# Patient Record
Sex: Female | Born: 1975
Health system: Southern US, Community
[De-identification: ages and names within clinical notes are randomized; demographics above are authoritative.]

## PROBLEM LIST (undated history)

## (undated) DIAGNOSIS — Z8619 Personal history of other infectious and parasitic diseases: Secondary | ICD-10-CM

## (undated) DIAGNOSIS — F329 Major depressive disorder, single episode, unspecified: Secondary | ICD-10-CM

## (undated) DIAGNOSIS — F32A Depression, unspecified: Secondary | ICD-10-CM

## (undated) DIAGNOSIS — R519 Headache, unspecified: Secondary | ICD-10-CM

## (undated) DIAGNOSIS — Z87442 Personal history of urinary calculi: Secondary | ICD-10-CM

## (undated) DIAGNOSIS — J45909 Unspecified asthma, uncomplicated: Secondary | ICD-10-CM

## (undated) DIAGNOSIS — N200 Calculus of kidney: Secondary | ICD-10-CM

## (undated) DIAGNOSIS — N289 Disorder of kidney and ureter, unspecified: Secondary | ICD-10-CM

## (undated) DIAGNOSIS — N390 Urinary tract infection, site not specified: Secondary | ICD-10-CM

## (undated) DIAGNOSIS — Z860100 Personal history of colon polyps, unspecified: Secondary | ICD-10-CM

## (undated) DIAGNOSIS — R51 Headache: Secondary | ICD-10-CM

## (undated) DIAGNOSIS — Z8489 Family history of other specified conditions: Secondary | ICD-10-CM

## (undated) HISTORY — DX: Headache, unspecified: R51.9

## (undated) HISTORY — DX: Personal history of colon polyps, unspecified: Z86.0100

## (undated) HISTORY — DX: Headache: R51

## (undated) HISTORY — DX: Personal history of colonic polyps: Z86.010

## (undated) HISTORY — DX: Urinary tract infection, site not specified: N39.0

## (undated) HISTORY — DX: Personal history of other infectious and parasitic diseases: Z86.19

## (undated) HISTORY — PX: ABLATION: SHX5711

## (undated) HISTORY — PX: CYSTOSCOPY W/ URETEROSCOPY: SUR379

---

## 2002-03-07 HISTORY — PX: LITHOTRIPSY: SUR834

## 2003-03-08 HISTORY — PX: TUBAL LIGATION: SHX77

## 2010-06-18 ENCOUNTER — Emergency Department (HOSPITAL_COMMUNITY)
Admission: EM | Admit: 2010-06-18 | Discharge: 2010-06-18 | Disposition: A | Discharge status: Home / Self Care | Payer: Medicaid Other | Attending: Emergency Medicine | Admitting: Emergency Medicine

## 2010-06-18 DIAGNOSIS — R059 Cough, unspecified: Secondary | ICD-10-CM | POA: Insufficient documentation

## 2010-06-18 DIAGNOSIS — R05 Cough: Secondary | ICD-10-CM | POA: Insufficient documentation

## 2010-06-18 DIAGNOSIS — J45909 Unspecified asthma, uncomplicated: Secondary | ICD-10-CM | POA: Insufficient documentation

## 2010-06-18 DIAGNOSIS — Y9289 Other specified places as the place of occurrence of the external cause: Secondary | ICD-10-CM | POA: Insufficient documentation

## 2010-06-18 DIAGNOSIS — S01501A Unspecified open wound of lip, initial encounter: Secondary | ICD-10-CM | POA: Insufficient documentation

## 2010-06-18 DIAGNOSIS — R55 Syncope and collapse: Secondary | ICD-10-CM | POA: Insufficient documentation

## 2010-06-18 DIAGNOSIS — W19XXXA Unspecified fall, initial encounter: Secondary | ICD-10-CM | POA: Insufficient documentation

## 2010-06-18 LAB — CBC
MCV: 86.9 fL (ref 78.0–100.0)
Platelets: 264 10*3/uL (ref 150–400)
RBC: 4.51 MIL/uL (ref 3.87–5.11)
WBC: 10.6 10*3/uL — ABNORMAL HIGH (ref 4.0–10.5)

## 2010-06-18 LAB — DIFFERENTIAL
Basophils Absolute: 0.1 10*3/uL (ref 0.0–0.1)
Basophils Relative: 1 % (ref 0–1)
Eosinophils Absolute: 0.8 10*3/uL — ABNORMAL HIGH (ref 0.0–0.7)
Lymphs Abs: 2.3 10*3/uL (ref 0.7–4.0)
Neutrophils Relative %: 65 % (ref 43–77)

## 2010-06-18 LAB — BASIC METABOLIC PANEL
Chloride: 107 mEq/L (ref 96–112)
GFR calc Af Amer: >60 mL/min (ref >60)
Potassium: 4 mEq/L (ref 3.5–5.1)
Sodium: 139 mEq/L (ref 135–145)

## 2010-11-16 ENCOUNTER — Other Ambulatory Visit (HOSPITAL_COMMUNITY)
Admission: RE | Admit: 2010-11-16 | Discharge: 2010-11-16 | Disposition: A | Discharge status: Home / Self Care | Payer: Commercial Managed Care - PPO | Source: Ambulatory Visit | Attending: Family Medicine | Admitting: Family Medicine

## 2010-11-16 ENCOUNTER — Other Ambulatory Visit: Payer: Self-pay | Admitting: Family Medicine

## 2010-11-16 DIAGNOSIS — Z1159 Encounter for screening for other viral diseases: Secondary | ICD-10-CM | POA: Insufficient documentation

## 2010-11-16 DIAGNOSIS — Z124 Encounter for screening for malignant neoplasm of cervix: Secondary | ICD-10-CM | POA: Insufficient documentation

## 2012-04-03 ENCOUNTER — Emergency Department (HOSPITAL_COMMUNITY)
Admission: EM | Admit: 2012-04-03 | Discharge: 2012-04-03 | Disposition: A | Discharge status: Home / Self Care | Payer: 59 | Attending: Emergency Medicine | Admitting: Emergency Medicine

## 2012-04-03 ENCOUNTER — Encounter (HOSPITAL_COMMUNITY): Payer: Self-pay | Admitting: Adult Health

## 2012-04-03 ENCOUNTER — Emergency Department (HOSPITAL_COMMUNITY): Payer: 59

## 2012-04-03 DIAGNOSIS — F329 Major depressive disorder, single episode, unspecified: Secondary | ICD-10-CM | POA: Insufficient documentation

## 2012-04-03 DIAGNOSIS — N200 Calculus of kidney: Secondary | ICD-10-CM | POA: Insufficient documentation

## 2012-04-03 DIAGNOSIS — R109 Unspecified abdominal pain: Secondary | ICD-10-CM | POA: Insufficient documentation

## 2012-04-03 DIAGNOSIS — Z79899 Other long term (current) drug therapy: Secondary | ICD-10-CM | POA: Insufficient documentation

## 2012-04-03 DIAGNOSIS — R110 Nausea: Secondary | ICD-10-CM | POA: Insufficient documentation

## 2012-04-03 DIAGNOSIS — Z3202 Encounter for pregnancy test, result negative: Secondary | ICD-10-CM | POA: Insufficient documentation

## 2012-04-03 DIAGNOSIS — M549 Dorsalgia, unspecified: Secondary | ICD-10-CM | POA: Insufficient documentation

## 2012-04-03 DIAGNOSIS — Z87448 Personal history of other diseases of urinary system: Secondary | ICD-10-CM | POA: Insufficient documentation

## 2012-04-03 DIAGNOSIS — F3289 Other specified depressive episodes: Secondary | ICD-10-CM | POA: Insufficient documentation

## 2012-04-03 HISTORY — DX: Major depressive disorder, single episode, unspecified: F32.9

## 2012-04-03 HISTORY — DX: Disorder of kidney and ureter, unspecified: N28.9

## 2012-04-03 HISTORY — DX: Depression, unspecified: F32.A

## 2012-04-03 LAB — CBC WITH DIFFERENTIAL/PLATELET
Basophils Absolute: 0 10*3/uL (ref 0.0–0.1)
HCT: 35 % — ABNORMAL LOW (ref 36.0–46.0)
Hemoglobin: 12.5 g/dL (ref 12.0–15.0)
Lymphocytes Relative: 27 % (ref 12–46)
Monocytes Absolute: 0.6 10*3/uL (ref 0.1–1.0)
Monocytes Relative: 6 % (ref 3–12)
Neutro Abs: 5.3 10*3/uL (ref 1.7–7.7)
Neutrophils Relative %: 59 % (ref 43–77)
RDW: 13.1 % (ref 11.5–15.5)
WBC: 9 10*3/uL (ref 4.0–10.5)

## 2012-04-03 LAB — URINALYSIS, ROUTINE W REFLEX MICROSCOPIC
Glucose, UA: NEGATIVE mg/dL
Leukocytes, UA: NEGATIVE
Nitrite: NEGATIVE
Specific Gravity, Urine: 1.028 (ref 1.005–1.030)
pH: 6 (ref 5.0–8.0)

## 2012-04-03 LAB — BASIC METABOLIC PANEL
CO2: 23 mEq/L (ref 19–32)
Chloride: 106 mEq/L (ref 96–112)
Creatinine, Ser: 1.1 mg/dL (ref 0.50–1.10)
GFR calc Af Amer: 73 mL/min — ABNORMAL LOW (ref >90)
Potassium: 3.8 mEq/L (ref 3.5–5.1)

## 2012-04-03 LAB — URINE MICROSCOPIC-ADD ON

## 2012-04-03 LAB — POCT PREGNANCY, URINE: Preg Test, Ur: NEGATIVE

## 2012-04-03 MED ORDER — KETOROLAC TROMETHAMINE 30 MG/ML IJ SOLN
30.0000 mg | Freq: Once | INTRAMUSCULAR | Status: AC
  Administered 2012-04-03: 30 mg via INTRAVENOUS
  Filled 2012-04-03: qty 1

## 2012-04-03 MED ORDER — SODIUM CHLORIDE 0.9 % IV SOLN
Freq: Once | INTRAVENOUS | Status: AC
  Administered 2012-04-03: 1000 mL via INTRAVENOUS

## 2012-04-03 MED ORDER — HYDROMORPHONE HCL PF 1 MG/ML IJ SOLN
1.0000 mg | Freq: Once | INTRAMUSCULAR | Status: AC
  Administered 2012-04-03: 1 mg via INTRAMUSCULAR
  Filled 2012-04-03: qty 1

## 2012-04-03 MED ORDER — ONDANSETRON HCL 4 MG/2ML IJ SOLN
4.0000 mg | Freq: Once | INTRAMUSCULAR | Status: AC
  Administered 2012-04-03: 4 mg via INTRAVENOUS
  Filled 2012-04-03: qty 2

## 2012-04-03 MED ORDER — HYDROCODONE-ACETAMINOPHEN 5-325 MG PO TABS
1.0000 | ORAL_TABLET | Freq: Once | ORAL | Status: AC
  Administered 2012-04-03: 1 via ORAL
  Filled 2012-04-03: qty 1

## 2012-04-03 MED ORDER — PROMETHAZINE HCL 25 MG PO TABS: 25.0000 mg | ORAL_TABLET | Freq: Four times a day (QID) | ORAL | Status: DC | PRN

## 2012-04-03 MED ORDER — HYDROCODONE-ACETAMINOPHEN 5-325 MG PO TABS: 2.0000 | ORAL_TABLET | ORAL | Status: DC | PRN

## 2012-04-03 NOTE — ED Provider Notes (Signed)
Medical screening examination/treatment/procedure(s) were performed by non-physician practitioner and as supervising physician I was immediately available for consultation/collaboration.  Sunnie Nielsen, MD 04/03/12 2257

## 2012-04-03 NOTE — ED Notes (Signed)
Patient transported to CT 

## 2012-04-03 NOTE — ED Provider Notes (Signed)
History     CSN: [id number]  Arrival date & time 04/03/12  0207   First MD Initiated Contact with Patient 04/03/12 0247      Chief Complaint  Patient presents with  . Nephrolithiasis    (Consider location/radiation/quality/duration/timing/severity/associated sxs/prior treatment) HPI Comments: Patient with a history of multiple kidney stones.  Last being in 2011.  She states the last 2, stone.  She's been able to pass spontaneously, but prior to, that she's had lithotripsy, and cystoscopy along with stenting.  She has been followed by a physician in [facility], but has recently moved to [place], and started seeing a light.  Urology tonight.  Her pain started about 7 PM by 10.  It was unbearable.  She's been taking over-the-counter abdomen, without relief.  She reports, right flank pain, radiating to the right lower abdomen, waxing and waning in intensity, associated with nausea, but no vomiting.   The history is provided by the patient.    Past Medical History  Diagnosis Date  . Renal disorder   . Depression     History reviewed. No pertinent past surgical history.  History reviewed. No pertinent family history.  History  Substance Use Topics  . Smoking status: Never Smoker   . Smokeless tobacco: Not on file  . Alcohol Use: No    OB History    Grav Para Term Preterm Abortions TAB SAB Ect Mult Living                  Review of Systems  Constitutional: Negative for fever and chills.  HENT: Negative.   Eyes: Negative.   Respiratory: Negative.   Cardiovascular: Negative.   Gastrointestinal: Positive for nausea and abdominal pain. Negative for vomiting, diarrhea and constipation.  Genitourinary: Positive for flank pain. Negative for dysuria, urgency, frequency and hematuria.  Musculoskeletal: Positive for back pain.  Skin: Negative for rash and wound.  Neurological: Negative.     Allergies  Penicillins  Home Medications   Current Outpatient Rx  Name  Route   Sig  Dispense  Refill  . DULOXETINE HCL 60 MG PO CPEP   Oral   Take 120 mg by mouth daily.         . IBUPROFEN 800 MG PO TABS   Oral   Take 800 mg by mouth every 8 (eight) hours as needed. pain         . LAMOTRIGINE 100 MG PO TABS   Oral   Take 100 mg by mouth daily.         Marland Kitchen HYDROCODONE-ACETAMINOPHEN 5-325 MG PO TABS   Oral   Take 2 tablets by mouth every 4 (four) hours as needed for pain.   10 tablet   0   . PROMETHAZINE HCL 25 MG PO TABS   Oral   Take 1 tablet (25 mg total) by mouth every 6 (six) hours as needed for nausea.   30 tablet   0     BP 130/85  Pulse 81  Temp 97.5 F (36.4 C) (Oral)  Resp 16  SpO2 99%  Physical Exam  Constitutional: She appears well-developed and well-nourished.  HENT:  Head: Normocephalic.  Eyes: Pupils are equal, round, and reactive to light.  Pulmonary/Chest: Effort normal.  Musculoskeletal: Normal range of motion.  Neurological: She is alert.  Skin: Skin is warm. There is pallor.    ED Course  Procedures (including critical care time)  Labs Reviewed  URINALYSIS, ROUTINE W REFLEX MICROSCOPIC - Abnormal; Notable for  the following:    Hgb urine dipstick SMALL (*)     All other components within normal limits  CBC WITH DIFFERENTIAL - Abnormal; Notable for the following:    HCT 35.0 (*)     Eosinophils Relative 8 (*)     All other components within normal limits  BASIC METABOLIC PANEL - Abnormal; Notable for the following:    Glucose, Bld 126 (*)     GFR calc non Af Amer 63 (*)     GFR calc Af Amer 73 (*)     All other components within normal limits  URINE MICROSCOPIC-ADD ON - Abnormal; Notable for the following:    Squamous Epithelial / LPF FEW (*)     Bacteria, UA FEW (*)     All other components within normal limits  POCT PREGNANCY, URINE  URINE CULTURE   Ct Abdomen Pelvis Wo Contrast  04/03/2012  *RADIOLOGY REPORT*  Clinical Data: Right flank pain  CT ABDOMEN AND PELVIS WITHOUT CONTRAST  Technique:   Multidetector CT imaging of the abdomen and pelvis was performed following the standard protocol without intravenous contrast.  Comparison: None.  Findings: Limited images through the lung bases demonstrate no significant appreciable abnormality. The heart size is within normal limits. No pleural or pericardial effusion.  Organ abnormality/lesion detection is limited in the absence of intravenous contrast. Within this limitation, geographic area of hypodensity within segment four is favored to be focal fat. Unremarkable biliary system, spleen, pancreas, adrenal glands.  Bilateral renal stones and papillary tip calcifications.  No hydronephrosis on the left.  On the right, there is moderate hydroureteronephrosis to the level of a 5 mm distal right ureteral stone.  Colonic diverticulosis.  No CT evidence for diverticulitis or colitis.  Normal appendix.  No bowel obstruction.  No free intraperitoneal air or fluid.  No lymphadenopathy.  Normal caliber aorta and branch vessels.  Decompressed bladder with a small amount of air non dependently, nonspecific.  Unremarkable unenhanced appearance to the uterus and adnexa.  No acute osseous finding.  L5 S1 degenerative disc disease.  IMPRESSION: Moderate right hydroureteronephrosis to the level of a 5 mm distal right ureteral stone.  Additional bilateral nonobstructing renal stones and papillary tip calcifications.  Small amount of air non dependently within the bladder. Differential includes infection, instrumentation, or fistulous communication to bowel.  There is colonic diverticulosis but no active inflammation or overt fistula.   Original Report Authenticated By: Jearld Lesch, M.D.      1. Kidney stone on right side       MDM   Feeling much better after IV Dilaudid and Toradol CT Scan shows 5 mm stone in disatal R ureter,  patient will not have a ride until 7 AM she would be kept and monitored until that time       Arman Filter, NP 04/03/12 1942

## 2012-04-03 NOTE — ED Notes (Signed)
Hx of Kidney stones. Presents today with right sided flank pain that began yesterday morning and has progressively worsened since midnight associated with nausea and inability to void. painis described as sharp and radiated to lower pelvis.

## 2012-04-03 NOTE — ED Notes (Signed)
Pt back from CT

## 2012-04-04 LAB — URINE CULTURE: Colony Count: NO GROWTH

## 2012-04-06 ENCOUNTER — Other Ambulatory Visit: Payer: Self-pay | Admitting: Urology

## 2012-04-06 ENCOUNTER — Encounter (HOSPITAL_BASED_OUTPATIENT_CLINIC_OR_DEPARTMENT_OTHER): Payer: Self-pay | Admitting: *Deleted

## 2012-04-06 NOTE — Progress Notes (Signed)
To [facility] at 0615-Hg on arrival-Npo after Mn-may take pain meds with small amt water if needed that am .

## 2012-04-08 NOTE — Anesthesia Preprocedure Evaluation (Addendum)
Anesthesia Evaluation  Patient identified by MRN, date of birth, ID band Patient awake    Reviewed: Allergy & Precautions, H&P , NPO status , Patient's Chart, lab work & pertinent test results  Airway Mallampati: II TM Distance: >3 FB Neck ROM: full    Dental No notable dental hx. (+) Teeth Intact and Dental Advisory Given   Pulmonary neg pulmonary ROS,  breath sounds clear to auscultation  Pulmonary exam normal       Cardiovascular Exercise Tolerance: Good negative cardio ROS  Rhythm:regular Rate:Normal     Neuro/Psych negative neurological ROS  negative psych ROS   GI/Hepatic negative GI ROS, Neg liver ROS,   Endo/Other  negative endocrine ROS  Renal/GU negative Renal ROS  negative genitourinary   Musculoskeletal   Abdominal   Peds  Hematology negative hematology ROS (+)   Anesthesia Other Findings   Reproductive/Obstetrics negative OB ROS                          Anesthesia Physical Anesthesia Plan  ASA: II  Anesthesia Plan: General   Post-op Pain Management:    Induction: Intravenous  Airway Management Planned: LMA  Additional Equipment:   Intra-op Plan:   Post-operative Plan:   Informed Consent: I have reviewed the patients History and Physical, chart, labs and discussed the procedure including the risks, benefits and alternatives for the proposed anesthesia with the patient or authorized representative who has indicated his/her understanding and acceptance.   Dental Advisory Given  Plan Discussed with: CRNA and Surgeon  Anesthesia Plan Comments:        Anesthesia Quick Evaluation  

## 2012-04-08 NOTE — H&P (Signed)
  H&P  Chief Complaint: Kidney stone  History of Present Illness: Monica Williams is a 37 y.o. year old who presents for ureteroscopic stone extraction of a symptomatic right distal ureteral stone. She was seen by Dr. Sherron Monday last week for this.Her original note is below:  In the last few days, she has had right flank pain associated with nausea. She went to the emergency room. She had a distal right stone in the lower ureter associated with moderate hydronephrosis. She had non-affecting stones bilaterally and perhaps calcification at the papillae. She has had multiple stents and lithotripsies and ureteroscopy's in the past.   There is no other modifying factors or associated signs or symptoms. There is no other aggravating or relieving factors. She is still having right-flank pain. The symptoms are moderate in severity and recurring. She tries to minimize Vicodin so she does not get drowsy. Phenergan controls her nausea.   Past Medical History  Diagnosis Date  . Renal disorder   . Depression     Past Surgical History  Procedure Date  . Lithotripsy 2004  . Cystoscopy w/ ureteroscopy   . Tubal ligation   . Ablation     Home Medications:  No prescriptions prior to admission    Allergies:  Allergies  Allergen Reactions  . Penicillins Hives    History reviewed. No pertinent family history.  Social History:  reports that she has never smoked. She does not have any smokeless tobacco history on file. She reports that she does not drink alcohol or use illicit drugs.  ROS: A complete review of systems was performed.  All systems are negative except for pertinent findings as noted. Right flank pain.  Physical Exam:  Vital signs in last 24 hours:   General:  Alert and oriented, No acute distress HEENT: Normocephalic, atraumatic Neck: No JVD or lymphadenopathy Cardiovascular: Regular rate and rhythm Lungs: Clear bilaterally Abdomen: Soft, nontender, nondistended, no abdominal  masses Back: No CVA tenderness Extremities: No edema Neurologic: Grossly intact  Laboratory Data:  No results found for this or any previous visit (from the past 24 hour(s)). Recent Results (from the past 240 hour(s))  URINE CULTURE     Status: Normal   Collection Time   04/03/12  2:33 AM      Component Value Range Status Comment   Specimen Description URINE, CLEAN CATCH   Final    Special Requests ADDED [phone]   Final    Culture  Setup Time 04/03/2012 03:59   Final    Colony Count NO GROWTH   Final    Culture NO GROWTH   Final    Report Status 04/04/2012 FINAL   Final    Creatinine:  Basename 04/03/12 0223  CREATININE 1.10    Radiologic Imaging: No results found.  Impression/Assessment:  6 mm right distal ureteral calculus  Plan:  Ureteroscopic stone extraction  Chelsea Aus 04/08/2012, 11:49 AM  Bertram Millard. Myrtis Maille MD

## 2012-04-09 ENCOUNTER — Ambulatory Visit (HOSPITAL_BASED_OUTPATIENT_CLINIC_OR_DEPARTMENT_OTHER): Payer: 59 | Admitting: Anesthesiology

## 2012-04-09 ENCOUNTER — Encounter (HOSPITAL_BASED_OUTPATIENT_CLINIC_OR_DEPARTMENT_OTHER): Payer: Self-pay | Admitting: Anesthesiology

## 2012-04-09 ENCOUNTER — Ambulatory Visit (HOSPITAL_BASED_OUTPATIENT_CLINIC_OR_DEPARTMENT_OTHER)
Admission: RE | Admit: 2012-04-09 | Discharge: 2012-04-09 | Disposition: A | Discharge status: Home / Self Care | Payer: 59 | Source: Ambulatory Visit | Attending: Urology | Admitting: Urology

## 2012-04-09 ENCOUNTER — Encounter (HOSPITAL_BASED_OUTPATIENT_CLINIC_OR_DEPARTMENT_OTHER): Payer: Self-pay | Admitting: *Deleted

## 2012-04-09 ENCOUNTER — Encounter (HOSPITAL_BASED_OUTPATIENT_CLINIC_OR_DEPARTMENT_OTHER)
Admission: RE | Disposition: A | Discharge status: Home / Self Care | Payer: Self-pay | Source: Ambulatory Visit | Attending: Urology

## 2012-04-09 DIAGNOSIS — N201 Calculus of ureter: Secondary | ICD-10-CM | POA: Insufficient documentation

## 2012-04-09 HISTORY — PX: HOLMIUM LASER APPLICATION: SHX5852

## 2012-04-09 HISTORY — PX: CYSTOSCOPY WITH RETROGRADE PYELOGRAM, URETEROSCOPY AND STENT PLACEMENT: SHX5789

## 2012-04-09 LAB — POCT HEMOGLOBIN-HEMACUE: Hemoglobin: 12.2 g/dL (ref 12.0–15.0)

## 2012-04-09 SURGERY — HOLMIUM LASER APPLICATION
Anesthesia: General | Site: Ureter | Laterality: Right | Wound class: Clean Contaminated

## 2012-04-09 MED ORDER — SODIUM CHLORIDE 0.9 % IR SOLN
Status: DC | PRN
  Administered 2012-04-09: 6000 mL via INTRAVESICAL

## 2012-04-09 MED ORDER — ACETAMINOPHEN 650 MG RE SUPP
650.0000 mg | RECTAL | Status: DC | PRN
  Filled 2012-04-09: qty 1

## 2012-04-09 MED ORDER — SODIUM CHLORIDE 0.9 % IV SOLN
250.0000 mL | INTRAVENOUS | Status: DC | PRN
  Filled 2012-04-09: qty 250

## 2012-04-09 MED ORDER — ONDANSETRON HCL 4 MG/2ML IJ SOLN
INTRAMUSCULAR | Status: DC | PRN
  Administered 2012-04-09: 4 mg via INTRAVENOUS

## 2012-04-09 MED ORDER — CIPROFLOXACIN HCL 250 MG PO TABS: 250.0000 mg | ORAL_TABLET | Freq: Two times a day (BID) | ORAL | Status: DC

## 2012-04-09 MED ORDER — BELLADONNA ALKALOIDS-OPIUM 16.2-60 MG RE SUPP
RECTAL | Status: DC | PRN
  Administered 2012-04-09: 1 via RECTAL

## 2012-04-09 MED ORDER — LACTATED RINGERS IV SOLN
INTRAVENOUS | Status: DC
  Filled 2012-04-09: qty 1000

## 2012-04-09 MED ORDER — MIDAZOLAM HCL 5 MG/5ML IJ SOLN
INTRAMUSCULAR | Status: DC | PRN
  Administered 2012-04-09: 2 mg via INTRAVENOUS

## 2012-04-09 MED ORDER — LACTATED RINGERS IV SOLN
INTRAVENOUS | Status: DC
  Administered 2012-04-09: 07:00:00 via INTRAVENOUS
  Filled 2012-04-09: qty 1000

## 2012-04-09 MED ORDER — ACETAMINOPHEN 325 MG PO TABS
650.0000 mg | ORAL_TABLET | ORAL | Status: DC | PRN
  Filled 2012-04-09: qty 2

## 2012-04-09 MED ORDER — LIDOCAINE HCL (CARDIAC) 20 MG/ML IV SOLN
INTRAVENOUS | Status: DC | PRN
  Administered 2012-04-09: 25 mg via INTRAVENOUS
  Administered 2012-04-09: 75 mg via INTRAVENOUS

## 2012-04-09 MED ORDER — FENTANYL CITRATE 0.05 MG/ML IJ SOLN
25.0000 ug | INTRAMUSCULAR | Status: DC | PRN
  Filled 2012-04-09: qty 1

## 2012-04-09 MED ORDER — KETOROLAC TROMETHAMINE 30 MG/ML IJ SOLN
30.0000 mg | Freq: Four times a day (QID) | INTRAMUSCULAR | Status: DC
  Filled 2012-04-09: qty 1

## 2012-04-09 MED ORDER — PROPOFOL 10 MG/ML IV BOLUS
INTRAVENOUS | Status: DC | PRN
  Administered 2012-04-09: 100 mg via INTRAVENOUS
  Administered 2012-04-09: 200 mg via INTRAVENOUS

## 2012-04-09 MED ORDER — OXYCODONE HCL 5 MG PO TABS
5.0000 mg | ORAL_TABLET | ORAL | Status: DC | PRN
  Filled 2012-04-09: qty 2

## 2012-04-09 MED ORDER — OXYBUTYNIN CHLORIDE 5 MG PO TABS: 5.0000 mg | ORAL_TABLET | Freq: Three times a day (TID) | ORAL | Status: DC

## 2012-04-09 MED ORDER — DEXAMETHASONE SODIUM PHOSPHATE 4 MG/ML IJ SOLN
INTRAMUSCULAR | Status: DC | PRN
  Administered 2012-04-09: 10 mg via INTRAVENOUS

## 2012-04-09 MED ORDER — CIPROFLOXACIN IN D5W 400 MG/200ML IV SOLN
400.0000 mg | INTRAVENOUS | Status: DC
  Filled 2012-04-09: qty 200

## 2012-04-09 MED ORDER — FENTANYL CITRATE 0.05 MG/ML IJ SOLN
INTRAMUSCULAR | Status: DC | PRN
  Administered 2012-04-09: 50 ug via INTRAVENOUS
  Administered 2012-04-09 (×3): 25 ug via INTRAVENOUS
  Administered 2012-04-09: 50 ug via INTRAVENOUS
  Administered 2012-04-09 (×2): 12.5 ug via INTRAVENOUS

## 2012-04-09 MED ORDER — ONDANSETRON HCL 4 MG/2ML IJ SOLN
4.0000 mg | Freq: Four times a day (QID) | INTRAMUSCULAR | Status: DC | PRN
  Filled 2012-04-09: qty 2

## 2012-04-09 MED ORDER — KETOROLAC TROMETHAMINE 30 MG/ML IJ SOLN
INTRAMUSCULAR | Status: DC | PRN
  Administered 2012-04-09: 30 mg via INTRAVENOUS

## 2012-04-09 MED ORDER — SODIUM CHLORIDE 0.9 % IJ SOLN
3.0000 mL | Freq: Two times a day (BID) | INTRAMUSCULAR | Status: DC
  Filled 2012-04-09: qty 3

## 2012-04-09 MED ORDER — CIPROFLOXACIN IN D5W 400 MG/200ML IV SOLN
400.0000 mg | Freq: Two times a day (BID) | INTRAVENOUS | Status: DC
  Administered 2012-04-09: 400 mg via INTRAVENOUS
  Filled 2012-04-09: qty 200

## 2012-04-09 MED ORDER — MORPHINE SULFATE 2 MG/ML IJ SOLN
2.0000 mg | INTRAMUSCULAR | Status: DC | PRN
  Filled 2012-04-09: qty 1

## 2012-04-09 MED ORDER — IOHEXOL 350 MG/ML SOLN
INTRAVENOUS | Status: DC | PRN
  Administered 2012-04-09: 5 mL

## 2012-04-09 MED ORDER — SODIUM CHLORIDE 0.9 % IJ SOLN
3.0000 mL | INTRAMUSCULAR | Status: DC | PRN
  Filled 2012-04-09: qty 3

## 2012-04-09 MED ORDER — ALBUTEROL SULFATE (5 MG/ML) 0.5% IN NEBU
2.5000 mg | INHALATION_SOLUTION | Freq: Four times a day (QID) | RESPIRATORY_TRACT | Status: DC | PRN
  Administered 2012-04-09: 2.5 mg via RESPIRATORY_TRACT
  Filled 2012-04-09: qty 0.5

## 2012-04-09 MED ORDER — LACTATED RINGERS IV SOLN
INTRAVENOUS | Status: DC | PRN
  Administered 2012-04-09: 07:00:00 via INTRAVENOUS

## 2012-04-09 SURGICAL SUPPLY — 34 items
ADAPTER CATH URET PLST 4-6FR (CATHETERS) ×3 IMPLANT
BAG DRAIN URO-CYSTO SKYTR STRL (DRAIN) ×3 IMPLANT
BASKET LASER NITINOL 1.9FR (BASKET) IMPLANT
BASKET STNLS GEMINI 4WIRE 3FR (BASKET) IMPLANT
BASKET ZERO TIP NITINOL 2.4FR (BASKET) ×3 IMPLANT
BRUSH URET BIOPSY 3F (UROLOGICAL SUPPLIES) IMPLANT
CANISTER SUCT LVC 12 LTR MEDI- (MISCELLANEOUS) ×3 IMPLANT
CATH INTERMIT  6FR 70CM (CATHETERS) ×3 IMPLANT
CATH URET 5FR 28IN CONE TIP (BALLOONS)
CATH URET 5FR 28IN OPEN ENDED (CATHETERS) IMPLANT
CATH URET 5FR 70CM CONE TIP (BALLOONS) IMPLANT
CLOTH BEACON ORANGE TIMEOUT ST (SAFETY) ×3 IMPLANT
DRAPE CAMERA CLOSED 9X96 (DRAPES) ×3 IMPLANT
ELECT REM PT RETURN 9FT ADLT (ELECTROSURGICAL)
ELECTRODE REM PT RTRN 9FT ADLT (ELECTROSURGICAL) IMPLANT
GLOVE BIO SURGEON STRL SZ7 (GLOVE) ×3 IMPLANT
GLOVE BIO SURGEON STRL SZ8 (GLOVE) ×3 IMPLANT
GLOVE INDICATOR 7.0 STRL GRN (GLOVE) ×3 IMPLANT
GOWN PREVENTION PLUS LG XLONG (DISPOSABLE) ×6 IMPLANT
GOWN STRL REIN XL XLG (GOWN DISPOSABLE) ×3 IMPLANT
GUIDEWIRE 0.038 PTFE COATED (WIRE) IMPLANT
GUIDEWIRE ANG ZIPWIRE 038X150 (WIRE) IMPLANT
GUIDEWIRE STR DUAL SENSOR (WIRE) ×3 IMPLANT
IV NS IRRIG 3000ML ARTHROMATIC (IV SOLUTION) ×6 IMPLANT
KIT BALLIN UROMAX 15FX10 (LABEL) IMPLANT
KIT BALLN UROMAX 15FX4 (MISCELLANEOUS) IMPLANT
KIT BALLN UROMAX 26 75X4 (MISCELLANEOUS)
LASER FIBER DISP (UROLOGICAL SUPPLIES) ×3 IMPLANT
PACK CYSTOSCOPY (CUSTOM PROCEDURE TRAY) ×3 IMPLANT
SET HIGH PRES BAL DIL (LABEL)
SHEATH ACCESS URETERAL 38CM (SHEATH) IMPLANT
SHEATH ACCESS URETERAL 54CM (SHEATH) IMPLANT
STENT CONTOUR 6FRX24X.038 (STENTS) IMPLANT
STENT URET 6FRX24 CONTOUR (STENTS) ×3 IMPLANT

## 2012-04-09 NOTE — Transfer of Care (Signed)
Immediate Anesthesia Transfer of Care Note  Patient: Monica Williams  Procedure(s) Performed: Procedure(s) (LRB): HOLMIUM LASER APPLICATION (Right) CYSTOSCOPY WITH RETROGRADE PYELOGRAM, URETEROSCOPY AND STENT PLACEMENT (Right)  Patient Location: PACU  Anesthesia Type: General  Level of Consciousness: awake, sedated, patient cooperative and responds to stimulation  Airway & Oxygen Therapy: Patient Spontanous Breathing and Patient connected to face mask oxygen  Post-op Assessment: Report given to PACU RN, Post -op Vital signs reviewed and stable and Patient moving all extremities  Post vital signs: Reviewed and stable  Complications: No apparent anesthesia complications

## 2012-04-09 NOTE — Anesthesia Postprocedure Evaluation (Signed)
  Anesthesia Post-op Note  Patient: Monica Williams  Procedure(s) Performed: Procedure(s) (LRB): HOLMIUM LASER APPLICATION (Right) CYSTOSCOPY WITH RETROGRADE PYELOGRAM, URETEROSCOPY AND STENT PLACEMENT (Right)  Patient Location: PACU  Anesthesia Type: General  Level of Consciousness: awake and alert   Airway and Oxygen Therapy: Patient Spontanous Breathing  Post-op Pain: mild  Post-op Assessment: Post-op Vital signs reviewed, Patient's Cardiovascular Status Stable, Respiratory Function Stable, Patent Airway and No signs of Nausea or vomiting  Last Vitals:  Filed Vitals:   04/09/12 0900  BP: 116/76  Pulse: 86  Temp:   Resp: 10    Post-op Vital Signs: stable   Complications: No apparent anesthesia complications

## 2012-04-09 NOTE — Op Note (Signed)
Preoperative diagnosis: Obstructing right ureteral calculus  Postoperative diagnosis: Same   Procedure: Cystoscopy, right retrograde ureteropyelogram with interpretive fluoroscopy, right ureteroscopy with holmium laser fracture and extraction of right ureteral calculus, placement of 6 French by 24 cm contour double-J stent with tether    Surgeon: Bertram Millard. Maily Debarge, M.D.   Anesthesia: Gen.   Complications: None  Specimen(s): Stone fragments  Drain(s): Previously mentioned stent  Indications: 37 year-old female with persistently symptomatic right distal ureteral stone. She was seen by Dr. Annitta Jersey mid last week, and requested management of the stone. It was recommended that she undergo, because of the stone location, cystoscopy, right ureteroscopy and holmium laser/extraction of the stone. I discussed the procedure with the patient this morning, including risks and complications. She understands these and desires to proceed.    Technique and findings: I properly introduced myself to the patient. Her surgical site was properly marked and she was taken to the operating room where general anesthetic was administered with the LMA. She had been in Mr. preoperative IV Cipro. She was then placed in the dorsolithotomy position. Genitalia and perineum were prepped and draped and proper timeout was performed.  I then passed a 22 French cystoscope into the bladder which was inspected and found to be normal. Both ureteral orifices were normal in configuration and location. I passed a 6 [place] open-ended catheter into the right ureteral orifice and performed a retrograde ureteropyelogram.  This revealed a normal distal ureteral appearance to the point of the stone which appeared as a filling defect. I was unable to get contrast around the stone which seemed to be highly obstructing.  At this point, a 0.038 inch sensor-tip guidewire was advanced through the scope, by the stone with a curl seen in the  right renal pelvis. The cystoscope and the open-ended ureteral catheter were removed, with the guidewire left in place. I then passed a 6 French rigid ureteroscope under direct vision through the urethra and up to the ureteral orifice which was easily entered. I passed the scope perhaps 4 cm up the ureter, and encounter the stone which was just proximal to a moderate ureteral stricture. I pushed the stone up a bit farther, and navigated the ureteroscope through the stricture. Because I did not think I can remove the stone without fragmentation, I then passed a 360  fiber through the scope, and using the holmium laser, fragmented the stone into quite a few smaller fragments which were then snared with the basket and extracted into the bladder. There was one small fragment that was impacted within the ureteral wall which, with care, was also clot with the basket and extracted to the bladder. Multiple tiny fragments were above the previous position the stone, and I thought would rinsed out quite easily by themselves. Following extraction of all significant stone fragments from the ureter, I removed the basket, the ureteroscope, and through cystoscope, placed a 6 French by 24 cm contour double-J stent. I left a tether on. Good proximal and distal curls were seen using fluoroscopic and cystoscopic guidance, respectively. I then drained the bladder, leaving the string through the urethra. We take this to the patient's inner thigh. A B. and O. suppository had been placed in the rectum prior to procedure.  Several fragments were obtained after draining the bladder. These were saved and given to the patient's friend. The procedure then terminated, the patient was awakened and taken to the PACU in stable condition.  She will be sent home on cephalexin x3  days, oxybutynin, and she has pain medicine at home. We will followup appropriately.

## 2012-04-09 NOTE — Anesthesia Procedure Notes (Signed)
Procedure Name: LMA Insertion Date/Time: 04/09/2012 7:43 AM Performed by: Jessica Priest Pre-anesthesia Checklist: Patient identified, Emergency Drugs available, Suction available and Patient being monitored Patient Re-evaluated:Patient Re-evaluated prior to inductionOxygen Delivery Method: Circle System Utilized Preoxygenation: Pre-oxygenation with 100% oxygen Intubation Type: IV induction Ventilation: Mask ventilation without difficulty LMA: LMA inserted LMA Size: 4.0 Number of attempts: 1 Airway Equipment and Method: bite block Placement Confirmation: positive ETCO2 Tube secured with: Tape Dental Injury: Teeth and Oropharynx as per pre-operative assessment

## 2012-04-10 ENCOUNTER — Encounter (HOSPITAL_BASED_OUTPATIENT_CLINIC_OR_DEPARTMENT_OTHER): Payer: Self-pay | Admitting: Urology

## 2012-07-03 ENCOUNTER — Other Ambulatory Visit (HOSPITAL_COMMUNITY)
Admission: RE | Admit: 2012-07-03 | Discharge: 2012-07-03 | Disposition: A | Discharge status: Home / Self Care | Payer: 59 | Source: Ambulatory Visit | Attending: Family Medicine | Admitting: Family Medicine

## 2012-07-03 ENCOUNTER — Other Ambulatory Visit: Payer: Self-pay | Admitting: Family Medicine

## 2012-07-03 DIAGNOSIS — Z01419 Encounter for gynecological examination (general) (routine) without abnormal findings: Secondary | ICD-10-CM | POA: Insufficient documentation

## 2012-07-03 DIAGNOSIS — Z1151 Encounter for screening for human papillomavirus (HPV): Secondary | ICD-10-CM | POA: Insufficient documentation

## 2013-05-10 ENCOUNTER — Emergency Department (HOSPITAL_COMMUNITY)
Admission: EM | Admit: 2013-05-10 | Discharge: 2013-05-10 | Disposition: A | Discharge status: Home / Self Care | Payer: 59 | Attending: Emergency Medicine | Admitting: Emergency Medicine

## 2013-05-10 ENCOUNTER — Emergency Department (HOSPITAL_COMMUNITY): Payer: 59

## 2013-05-10 ENCOUNTER — Encounter (HOSPITAL_COMMUNITY): Payer: Self-pay | Admitting: Emergency Medicine

## 2013-05-10 DIAGNOSIS — F3289 Other specified depressive episodes: Secondary | ICD-10-CM | POA: Insufficient documentation

## 2013-05-10 DIAGNOSIS — S0003XA Contusion of scalp, initial encounter: Secondary | ICD-10-CM | POA: Insufficient documentation

## 2013-05-10 DIAGNOSIS — W010XXA Fall on same level from slipping, tripping and stumbling without subsequent striking against object, initial encounter: Secondary | ICD-10-CM | POA: Insufficient documentation

## 2013-05-10 DIAGNOSIS — F329 Major depressive disorder, single episode, unspecified: Secondary | ICD-10-CM | POA: Insufficient documentation

## 2013-05-10 DIAGNOSIS — Y9389 Activity, other specified: Secondary | ICD-10-CM | POA: Insufficient documentation

## 2013-05-10 DIAGNOSIS — Z79899 Other long term (current) drug therapy: Secondary | ICD-10-CM | POA: Insufficient documentation

## 2013-05-10 DIAGNOSIS — W19XXXA Unspecified fall, initial encounter: Secondary | ICD-10-CM

## 2013-05-10 DIAGNOSIS — N289 Disorder of kidney and ureter, unspecified: Secondary | ICD-10-CM | POA: Insufficient documentation

## 2013-05-10 DIAGNOSIS — Y929 Unspecified place or not applicable: Secondary | ICD-10-CM | POA: Insufficient documentation

## 2013-05-10 DIAGNOSIS — R110 Nausea: Secondary | ICD-10-CM | POA: Insufficient documentation

## 2013-05-10 DIAGNOSIS — M549 Dorsalgia, unspecified: Secondary | ICD-10-CM | POA: Insufficient documentation

## 2013-05-10 DIAGNOSIS — S1093XA Contusion of unspecified part of neck, initial encounter: Principal | ICD-10-CM

## 2013-05-10 DIAGNOSIS — R51 Headache: Secondary | ICD-10-CM | POA: Insufficient documentation

## 2013-05-10 DIAGNOSIS — S0083XA Contusion of other part of head, initial encounter: Principal | ICD-10-CM

## 2013-05-10 DIAGNOSIS — R6884 Jaw pain: Secondary | ICD-10-CM | POA: Insufficient documentation

## 2013-05-10 DIAGNOSIS — Z880 Allergy status to penicillin: Secondary | ICD-10-CM | POA: Insufficient documentation

## 2013-05-10 MED ORDER — ONDANSETRON 4 MG PO TBDP
4.0000 mg | ORAL_TABLET | Freq: Once | ORAL | Status: AC
  Administered 2013-05-10: 4 mg via ORAL
  Filled 2013-05-10: qty 1

## 2013-05-10 MED ORDER — IBUPROFEN 600 MG PO TABS: 600.0000 mg | ORAL_TABLET | Freq: Four times a day (QID) | ORAL | Status: DC | PRN

## 2013-05-10 MED ORDER — HYDROCODONE-ACETAMINOPHEN 5-325 MG PO TABS: 1.0000 | ORAL_TABLET | Freq: Four times a day (QID) | ORAL | Status: DC | PRN

## 2013-05-10 MED ORDER — OXYCODONE-ACETAMINOPHEN 5-325 MG PO TABS
1.0000 | ORAL_TABLET | Freq: Once | ORAL | Status: AC
  Administered 2013-05-10: 1 via ORAL
  Filled 2013-05-10: qty 1

## 2013-05-10 NOTE — Discharge Instructions (Signed)
Hematoma   You were found to have a scalp hematoma. CT scan is negative. Likely be more sore in the next day or 2. If you have any new or worsening symptoms she needs to be reevaluated.  A hematoma is a collection of blood under the skin, in an organ, in a body space, in a joint space, or in other tissue. The blood can clot to form a lump that you can see and feel. The lump is often firm and may sometimes become sore and tender. Most hematomas get better in a few days to weeks. However, some hematomas may be serious and require medical care. Hematomas can range in size from very small to very large. CAUSES  A hematoma can be caused by a blunt or penetrating injury. It can also be caused by spontaneous leakage from a blood vessel under the skin. Spontaneous leakage from a blood vessel is more likely to occur in older people, especially those taking blood thinners. Sometimes, a hematoma can develop after certain medical procedures. SIGNS AND SYMPTOMS   A firm lump on the body.  Possible pain and tenderness in the area.  Bruising.Blue, dark blue, purple-red, or yellowish skin may appear at the site of the hematoma if the hematoma is close to the surface of the skin. For hematomas in deeper tissues or body spaces, the signs and symptoms may be subtle. For example, an intra-abdominal hematoma may cause abdominal pain, weakness, fainting, and shortness of breath. An intracranial hematoma may cause a headache or symptoms such as weakness, trouble speaking, or a change in consciousness. DIAGNOSIS  A hematoma can usually be diagnosed based on your medical history and a physical exam. Imaging tests may be needed if your health care provider suspects a hematoma in deeper tissues or body spaces, such as the abdomen, head, or chest. These tests may include ultrasonography or a CT scan.  TREATMENT  Hematomas usually go away on their own over time. Rarely does the blood need to be drained out of the body. Large  hematomas or those that may affect vital organs will sometimes need surgical drainage or monitoring. HOME CARE INSTRUCTIONS   Apply ice to the injured area:   Put ice in a plastic bag.   Place a towel between your skin and the bag.   Leave the ice on for 20 minutes, 2 3 times a day for the first 1 to 2 days.   After the first 2 days, switch to using warm compresses on the hematoma.   Elevate the injured area to help decrease pain and swelling. Wrapping the area with an elastic bandage may also be helpful. Compression helps to reduce swelling and promotes shrinking of the hematoma. Make sure the bandage is not wrapped too tight.   If your hematoma is on a lower extremity and is painful, crutches may be helpful for a couple days.   Only take over-the-counter or prescription medicines as directed by your health care provider. SEEK IMMEDIATE MEDICAL CARE IF:   You have increasing pain, or your pain is not controlled with medicine.   You have a fever.   You have worsening swelling or discoloration.   Your skin over the hematoma breaks or starts bleeding.   Your hematoma is in your chest or abdomen and you have weakness, shortness of breath, or a change in consciousness.  Your hematoma is on your scalp (caused by a fall or injury) and you have a worsening headache or a change in alertness  or consciousness. MAKE SURE YOU:   Understand these instructions.  Will watch your condition.  Will get help right away if you are not doing well or get worse. Document Released: 10/06/2003 Document Revised: 10/24/2012 Document Reviewed: 08/01/2012 [facility] Patient Information 2014 [place], [place].

## 2013-05-10 NOTE — ED Notes (Signed)
Dr. Wilkie AyeHorton provided pt with crackers and dt. soda

## 2013-05-10 NOTE — ED Provider Notes (Signed)
CSN: [id number]     Arrival date & time 05/10/13  0940 History   First MD Initiated Contact with Patient 05/10/13 1032     Chief Complaint  Patient presents with  . Fall     (Consider location/radiation/quality/duration/timing/severity/associated sxs/prior Treatment) HPI  This is a 38 year old female with a history of depression and renal disorder who presents following a fall. Patient reports a mechanical fall when she slipped and fell face. She states that she fell backwards hitting her head. She denies loss of consciousness. She reports headache and nausea. This happened approximately one hour prior to arrival. She has been ambulatory and was able to drive herself to work after the fall. She denies any vision changes. Patient is not on aspirin, Plavix, or any other blood thinners.  She denies any other injury.  Past Medical History  Diagnosis Date  . Renal disorder   . Depression    Past Surgical History  Procedure Laterality Date  . Lithotripsy  2004  . Cystoscopy w/ ureteroscopy    . Tubal ligation    . Ablation    . Holmium laser application  04/09/2012    Procedure: HOLMIUM LASER APPLICATION;  Surgeon: Marcine MatarStephen Dahlstedt, MD;  [facility] [place];  Service: Urology;  Laterality: Right;  . Cystoscopy with retrograde pyelogram, ureteroscopy and stent placement  04/09/2012    Procedure: CYSTOSCOPY WITH RETROGRADE PYELOGRAM, URETEROSCOPY AND STENT PLACEMENT;  Surgeon: Marcine MatarStephen Dahlstedt, MD;  [facility] [place];  Service: Urology;  Laterality: Right;   No family history on file. History  Substance Use Topics  . Smoking status: Never Smoker   . Smokeless tobacco: Not on file  . Alcohol Use: No   OB History   Grav Para Term Preterm Abortions TAB SAB Ect Mult Living                 Review of Systems  HENT:       Jaw pain  Respiratory: Negative for chest tightness and shortness of breath.   Cardiovascular: Negative for chest pain.   Gastrointestinal: Positive for nausea. Negative for vomiting and abdominal pain.  Genitourinary: Negative for dysuria.  Musculoskeletal: Positive for back pain. Negative for neck pain and neck stiffness.  Skin: Negative for wound.  Neurological: Positive for headaches. Negative for dizziness, syncope and weakness.  Psychiatric/Behavioral: Negative for confusion.  All other systems reviewed and are negative.      Allergies  Penicillins  Home Medications   Current Outpatient Rx  Name  Route  Sig  Dispense  Refill  . DULoxetine (CYMBALTA) 60 MG capsule   Oral   Take 120 mg by mouth daily.         Marland Kitchen. lamoTRIgine (LAMICTAL) 100 MG tablet   Oral   Take 100 mg by mouth 2 (two) times daily.          . montelukast (SINGULAIR) 10 MG tablet   Oral   Take 10 mg by mouth at bedtime.         Marland Kitchen. HYDROcodone-acetaminophen (NORCO/VICODIN) 5-325 MG per tablet   Oral   Take 1 tablet by mouth every 6 (six) hours as needed.   10 tablet   0   . ibuprofen (ADVIL,MOTRIN) 600 MG tablet   Oral   Take 1 tablet (600 mg total) by mouth every 6 (six) hours as needed.   30 tablet   0    BP 127/80  Pulse 77  Temp(Src) 97.6 F (36.4 C) (Oral)  Resp  18  SpO2 99% Physical Exam  Nursing note and vitals reviewed. Constitutional: She is oriented to person, place, and time. She appears well-developed and well-nourished. No distress.  HENT:  Head: Normocephalic.  Mouth/Throat: Oropharynx is clear and moist.  Small hematoma noted over the left parietal scalp, no laceration, midface stable, no jaw malalignment noted  Eyes: Pupils are equal, round, and reactive to light.  Neck: Neck supple.  No midline C-spine tenderness, normal range of motion  Cardiovascular: Normal rate, regular rhythm and normal heart sounds.   No murmur heard. Pulmonary/Chest: Effort normal and breath sounds normal. No respiratory distress. She has no wheezes.  Abdominal: Soft. Bowel sounds are normal. There is no  tenderness.  Musculoskeletal:  Right paraspinous muscle tenderness, no midline tenderness, step-off, or deformity Normal range of motion of the bilateral hips, knees, and elbows  Neurological: She is alert and oriented to person, place, and time.  Skin: Skin is warm and dry.  Psychiatric: She has a normal mood and affect.    ED Course  Procedures (including critical care time) Labs Review Labs Reviewed - No data to display Imaging Review Ct Head Wo Contrast  05/10/2013   CLINICAL DATA:  Fall.  Head trauma.  Posterior head intact.  EXAM: CT HEAD WITHOUT CONTRAST  TECHNIQUE: Contiguous axial images were obtained from the base of the skull through the vertex without intravenous contrast.  COMPARISON:  None.  FINDINGS: No mass lesion, mass effect, midline shift, hydrocephalus, hemorrhage. No territorial ischemia or acute infarction. There is no skull fracture. Scalp hematomas present over the parietal region, eccentric to the left. Paranasal sinuses and mastoid air cells appear clear.  IMPRESSION: Negative CT brain.  Parietal scalp hematoma.   Electronically Signed   By: Andreas Newport M.D.   On: 05/10/2013 11:08     EKG Interpretation None      MDM   Final diagnoses:  Fall  Scalp hematoma    Patient presents following a fall. She is nontoxic and nonfocal on exam. She does have evidence of scalp hematoma and is reporting nausea. For this reason, will obtain CT scan of the head. CT negative.  C-spine cleared by Nexus criteria. No evidence of other injury. Patient was given Percocet for her pain. She's continuing to report sharp pain. She did not fall her face and has no evidence of misalignment or dislocation or instability. Patient was instructed to use NSAIDs and Norco as needed for pain. She's given strict return precautions.  After history, exam, and medical workup I feel the patient has been appropriately medically screened and is safe for discharge home. Pertinent diagnoses were  discussed with the patient. Patient was given return precautions.     Shon Baton, MD 05/11/13 [phone]

## 2013-05-10 NOTE — ED Notes (Signed)
Per pt, states she slipped on ice and fell on back and hit back of head twice, mostly on left-does not think she lost consciousness--states no complaints at time of fall-now feeling nauseated and head hurts

## 2014-02-05 ENCOUNTER — Ambulatory Visit (INDEPENDENT_AMBULATORY_CARE_PROVIDER_SITE_OTHER): Payer: 59

## 2014-02-05 ENCOUNTER — Ambulatory Visit (INDEPENDENT_AMBULATORY_CARE_PROVIDER_SITE_OTHER): Payer: 59 | Admitting: Podiatrist

## 2014-02-05 DIAGNOSIS — R52 Pain, unspecified: Secondary | ICD-10-CM

## 2014-02-05 DIAGNOSIS — M7732 Calcaneal spur, left foot: Secondary | ICD-10-CM

## 2014-02-05 MED ORDER — DOXYCYCLINE HYCLATE 100 MG PO TABS: 100.0000 mg | ORAL_TABLET | Freq: Two times a day (BID) | ORAL | Status: DC

## 2014-02-09 NOTE — Progress Notes (Signed)
Chief Complaint  Patient presents with  . tendonitis    "It hurts below the achilles.  left heel posterior     HPI: Patient is 38 y.o. female who presents today for heel pain posterior left heel. She denies any trauma or injury. She has tried rest, ice and antiinflammatories with no relief.  Pain with stretching the foot back is also reported.     Allergies  Allergen Reactions  . Penicillins Hives    Physical Exam  Patient is awake, alert, and oriented x 3.  In no acute distress.  Vascular status is intact with palpable pedal pulses at 2/4 DP and PT bilateral and capillary refill time within normal limits. Neurological sensation is also intact bilaterally via Semmes Weinstein monofilament at 5/5 sites. Light touch, vibratory sensation, Achilles tendon reflex is intact. Dermatological exam reveals skin color, turger and texture as normal. No open lesions present.  Musculature intact with dorsiflexion, plantarflexion, inversion, eversion. Pain along the posterior central and lateral heel is noted with standing and direct pressure.  xrays reveal a possible fracturing of the posterior spur seen on the lateral view and at the point of discomfort.   Assessment: heel spur posterior with possible fracture  Plan: air fracture walker was recommended and fitted. We will check the progress.

## 2014-05-17 ENCOUNTER — Encounter (HOSPITAL_COMMUNITY): Payer: Self-pay | Admitting: Emergency Medicine

## 2014-05-17 ENCOUNTER — Emergency Department (HOSPITAL_COMMUNITY)
Admission: EM | Admit: 2014-05-17 | Discharge: 2014-05-17 | Disposition: A | Discharge status: Home / Self Care | Payer: 59 | Source: Home / Self Care | Attending: Family Medicine | Admitting: Family Medicine

## 2014-05-17 DIAGNOSIS — J9801 Acute bronchospasm: Secondary | ICD-10-CM

## 2014-05-17 MED ORDER — IPRATROPIUM-ALBUTEROL 0.5-2.5 (3) MG/3ML IN SOLN
RESPIRATORY_TRACT | Status: AC
  Filled 2014-05-17: qty 3

## 2014-05-17 MED ORDER — ALBUTEROL SULFATE (2.5 MG/3ML) 0.083% IN NEBU
INHALATION_SOLUTION | RESPIRATORY_TRACT | Status: AC
  Filled 2014-05-17: qty 3

## 2014-05-17 MED ORDER — IPRATROPIUM-ALBUTEROL 0.5-2.5 (3) MG/3ML IN SOLN
3.0000 mL | Freq: Once | RESPIRATORY_TRACT | Status: AC
  Administered 2014-05-17: 3 mL via RESPIRATORY_TRACT

## 2014-05-17 MED ORDER — METHYLPREDNISOLONE SODIUM SUCC 125 MG IJ SOLR
80.0000 mg | Freq: Once | INTRAMUSCULAR | Status: AC
  Administered 2014-05-17: 80 mg via INTRAMUSCULAR

## 2014-05-17 MED ORDER — ALBUTEROL SULFATE (2.5 MG/3ML) 0.083% IN NEBU
2.5000 mg | INHALATION_SOLUTION | Freq: Once | RESPIRATORY_TRACT | Status: AC
  Administered 2014-05-17: 2.5 mg via RESPIRATORY_TRACT

## 2014-05-17 MED ORDER — METHYLPREDNISOLONE SODIUM SUCC 125 MG IJ SOLR
INTRAMUSCULAR | Status: AC
  Filled 2014-05-17: qty 2

## 2014-05-17 NOTE — ED Notes (Signed)
C/o  Deep cough since the week of the 22nd of February.   Was seen twice by provider and given prednisone  And a zpak on the second office visit.  Pt states symptoms have gotten  Significantly worse.  States "I have a heaviness in my chest. Having asthma attack back to back and no relief with inhalers.    Denies fever, n/v/d

## 2014-05-17 NOTE — ED Provider Notes (Addendum)
CSN: [id number]     Arrival date & time 05/17/14  1756 History   First MD Initiated Contact with Patient 05/17/14 1934     Chief Complaint  Patient presents with  . Cough   (Consider location/radiation/quality/duration/timing/severity/associated sxs/prior Treatment) HPI Comments: 39 year old obese female complaining of upper respiratory congestion, sinus congestion, chest congestion which started approximately 3 weeks ago. 2 weeks ago she developed a cough which has been persistent since. She is seen her physician twice and has been treated with Z-Pak and oral prednisone. She is using a albuterol HFA and seems to be getting worse instead of better. Denies fever. She was taking NyQuil for the first week for upper respiratory congestion symptoms but decided to stop after 7 days. She is currently taking Singulair, Allegra 180 mg and Promethazine DM.   Past Medical History  Diagnosis Date  . Renal disorder   . Depression    Past Surgical History  Procedure Laterality Date  . Lithotripsy  2004  . Cystoscopy w/ ureteroscopy    . Tubal ligation    . Ablation    . Holmium laser application  04/09/2012    Procedure: HOLMIUM LASER APPLICATION;  Surgeon: Marcine Matar, MD;  Location: [facility];  Service: Urology;  Laterality: Right;  . Cystoscopy with retrograde pyelogram, ureteroscopy and stent placement  04/09/2012    Procedure: CYSTOSCOPY WITH RETROGRADE PYELOGRAM, URETEROSCOPY AND STENT PLACEMENT;  Surgeon: Marcine Matar, MD;  Location: [facility];  Service: Urology;  Laterality: Right;   History reviewed. No pertinent family history. History  Substance Use Topics  . Smoking status: Never Smoker   . Smokeless tobacco: Not on file  . Alcohol Use: No   OB History    No data available     Review of Systems  Constitutional: Negative for fever, activity change and fatigue.  HENT: Positive for congestion. Negative for ear pain, postnasal drip,  rhinorrhea and sore throat.   Respiratory: Positive for cough, chest tightness and shortness of breath.   Cardiovascular: Negative for chest pain.  Gastrointestinal: Negative.   Genitourinary: Negative.   Skin: Negative.   Neurological: Negative.     Allergies  Penicillins  Home Medications   Prior to Admission medications   Medication Sig Start Date End Date Taking? Authorizing Provider  fexofenadine-pseudoephedrine (ALLEGRA-D 24) 180-240 MG per 24 hr tablet Take 1 tablet by mouth daily.   Yes Historical Provider, MD  montelukast (SINGULAIR) 10 MG tablet Take 10 mg by mouth at bedtime.   Yes Historical Provider, MD  DULoxetine (CYMBALTA) 60 MG capsule Take 120 mg by mouth daily.    Historical Provider, MD  HYDROcodone-acetaminophen (NORCO/VICODIN) 5-325 MG per tablet Take 1 tablet by mouth every 6 (six) hours as needed. 05/10/13   Shon Baton, MD  ibuprofen (ADVIL,MOTRIN) 600 MG tablet Take 1 tablet (600 mg total) by mouth every 6 (six) hours as needed. 05/10/13   Shon Baton, MD  lamoTRIgine (LAMICTAL) 100 MG tablet Take 100 mg by mouth 2 (two) times daily.     Historical Provider, MD   BP 125/73 mmHg  Pulse 95  Temp(Src) 98.2 F (36.8 C) (Oral)  Resp 16  SpO2 100% Physical Exam  Constitutional: She is oriented to person, place, and time. She appears well-developed and well-nourished. No distress.  HENT:  Mouth/Throat: No oropharyngeal exudate.  Bilateral TMs are normal Oropharynx with minor streaky erythema.  Eyes: Conjunctivae and EOM are normal.  Neck: Normal range of motion. Neck supple.  Cardiovascular: Normal rate, regular rhythm and normal heart sounds.   Pulmonary/Chest: Effort normal. No respiratory distress.  Rare  expiratory wheeze. Taking a deep breath produces cough spasms. Otherwise moving air well.   Lymphadenopathy:    She has no cervical adenopathy.  Neurological: She is alert and oriented to person, place, and time.  Skin: Skin is warm and dry.   Nursing note and vitals reviewed.   ED Course  Procedures (including critical care time) Labs Review Labs Reviewed - No data to display  Imaging Review No results found.   MDM   1. Bronchospasm, acute    Post nebulizer of albuterol 5 mg and Atrovent 2.5 mg patient states she is breathing and feeling much better. Her lungs are now clear air movement is much improved and she feels as that she can go home now. She will continue with her albuterol HFA as necessary and will call her PCP Monday for follow-up. She knows that she may return if she believes she needs another nebulized treatment or is getting worse. Continue your other medications. Methylprednisolone 80 mg IM   Hayden Rasmussenavid Jonah Nestle, NP 05/17/14 2026  Hayden Rasmussenavid Mcgwire Dasaro, NP 05/19/14 1438

## 2014-05-17 NOTE — Discharge Instructions (Signed)
Bronchospasm °A bronchospasm is a spasm or tightening of the airways going into the lungs. During a bronchospasm breathing becomes more difficult because the airways get smaller. When this happens there can be coughing, a whistling sound when breathing (wheezing), and difficulty breathing. Bronchospasm is often associated with asthma, but not all patients who experience a bronchospasm have asthma. °CAUSES  °A bronchospasm is caused by inflammation or irritation of the airways. The inflammation or irritation may be triggered by:  °1. Allergies (such as to animals, pollen, food, or mold). Allergens that cause bronchospasm may cause wheezing immediately after exposure or many hours later.   °2. Infection. Viral infections are believed to be the most common cause of bronchospasm.   °3. Exercise.   °4. Irritants (such as pollution, cigarette smoke, strong odors, aerosol sprays, and paint fumes).   °5. Weather changes. Winds increase molds and pollens in the air. Rain refreshes the air by washing irritants out. Cold air may cause inflammation.   °6. Stress and emotional upset.   °SIGNS AND SYMPTOMS  °· Wheezing.   °· Excessive nighttime coughing.   °· Frequent or severe coughing with a simple cold.   °· Chest tightness.   °· Shortness of breath.   °DIAGNOSIS  °Bronchospasm is usually diagnosed through a history and physical exam. Tests, such as chest X-rays, are sometimes done to look for other conditions. °TREATMENT  °· Inhaled medicines can be given to open up your airways and help you breathe. The medicines can be given using either an inhaler or a nebulizer machine. °· Corticosteroid medicines may be given for severe bronchospasm, usually when it is associated with asthma. °HOME CARE INSTRUCTIONS  °· Always have a plan prepared for seeking medical care. Know when to call your health care provider and local emergency services (911 in the [place] Know where you can access local emergency care. °· Only take medicines as  directed by your health care provider. °· If you were prescribed an inhaler or nebulizer machine, ask your health care provider to explain how to use it correctly. Always use a spacer with your inhaler if you were given one. °· It is necessary to remain calm during an attack. Try to relax and breathe more slowly.  °· Control your home environment in the following ways:   °· Change your heating and air conditioning filter at least once a month.   °· Limit your use of fireplaces and wood stoves. °· Do not smoke and do not allow smoking in your home.   °· Avoid exposure to perfumes and fragrances.   °· Get rid of pests (such as roaches and mice) and their droppings.   °· Throw away plants if you see mold on them.   °· Keep your house clean and dust free.   °· Replace carpet with wood, tile, or vinyl flooring. Carpet can trap dander and dust.   °· Use allergy-proof pillows, mattress covers, and box spring covers.   °· Wash bed sheets and blankets every week in hot water and dry them in a dryer.   °· Use blankets that are made of polyester or cotton.   °· Wash hands frequently. °SEEK MEDICAL CARE IF:  °· You have muscle aches.   °· You have chest pain.   °· The sputum changes from clear or white to yellow, green, gray, or bloody.   °· The sputum you cough up gets thicker.   °· There are problems that may be related to the medicine you are given, such as a rash, itching, swelling, or trouble breathing.   °SEEK IMMEDIATE MEDICAL CARE IF:  °· You have worsening wheezing and coughing even   after taking your prescribed medicines.   °· You have increased difficulty breathing.   °· You develop severe chest pain. °MAKE SURE YOU:  °· Understand these instructions. °· Will watch your condition. °· Will get help right away if you are not doing well or get worse. °Document Released: 02/24/2003 Document Revised: 02/26/2013 Document Reviewed: 08/13/2012 °Exit[employer]® Patient Information ©2015 [employer]. This information is not  intended to replace advice given to you by your health care provider. Make sure you discuss any questions you have with your health care provider. ° °How to Use an Inhaler °Using your inhaler correctly is very important. Good technique will make sure that the medicine reaches your lungs.  °HOW TO USE AN INHALER: °7. Take the cap off the inhaler. °8. If this is the first time using your inhaler, you need to prime it. Shake the inhaler for 5 seconds. Release four puffs into the air, away from your face. Ask your doctor for help if you have questions. °9. Shake the inhaler for 5 seconds. °10. Turn the inhaler so the bottle is above the mouthpiece. °11. Put your pointer finger on top of the bottle. Your thumb holds the bottom of the inhaler. °12. Open your mouth. °13. Either hold the inhaler away from your mouth (the width of 2 fingers) or place your lips tightly around the mouthpiece. Ask your doctor which way to use your inhaler. °14. Breathe out as much air as possible. °15. Breathe in and push down on the bottle 1 time to release the medicine. You will feel the medicine go in your mouth and throat. °16. Continue to take a deep breath in very slowly. Try to fill your lungs. °17. After you have breathed in completely, hold your breath for 10 seconds. This will help the medicine to settle in your lungs. If you cannot hold your breath for 10 seconds, hold it for as long as you can before you breathe out. °18. Breathe out slowly, through pursed lips. Whistling is an example of pursed lips. °19. If your doctor has told you to take more than 1 puff, wait at least 15-30 seconds between puffs. This will help you get the best results from your medicine. Do not use the inhaler more than your doctor tells you to. °20. Put the cap back on the inhaler. °21. Follow the directions from your doctor or from the inhaler package about cleaning the inhaler. °If you use more than one inhaler, ask your doctor which inhalers to use and  what order to use them in. Ask your doctor to help you figure out when you will need to refill your inhaler.  °If you use a steroid inhaler, always rinse your mouth with water after your last puff, gargle and spit out the water. Do not swallow the water. °GET HELP IF: °· The inhaler medicine only partially helps to stop wheezing or shortness of breath. °· You are having trouble using your inhaler. °· You have some increase in thick spit (phlegm). °GET HELP RIGHT AWAY IF: °· The inhaler medicine does not help your wheezing or shortness of breath or you have tightness in your chest. °· You have dizziness, headaches, or fast heart rate. °· You have chills, fever, or night sweats. °· You have a large increase of thick spit, or your thick spit is bloody. °MAKE SURE YOU:  °· Understand these instructions. °· Will watch your condition. °· Will get help right away if you are not doing well or get worse. °Document   Released: 12/01/2007 Document Revised: 12/12/2012 Document Reviewed: 09/20/2012 °Exit[employer]® Patient Information ©2015 [employer]. This information is not intended to replace advice given to you by your health care provider. Make sure you discuss any questions you have with your health care provider. ° °

## 2015-03-28 DIAGNOSIS — F3110 Bipolar disorder, current episode manic without psychotic features, unspecified: Secondary | ICD-10-CM | POA: Diagnosis not present

## 2015-04-03 MED FILL — OXcarbazepine 300 MG TABS: 300 | 30 days supply | Qty: 60 | Fill #0

## 2015-06-10 MED FILL — OXcarbazepine 300 MG TABS: 300 | 30 days supply | Qty: 60 | Fill #1

## 2015-06-16 DIAGNOSIS — J301 Allergic rhinitis due to pollen: Secondary | ICD-10-CM | POA: Diagnosis not present

## 2015-06-16 DIAGNOSIS — J3081 Allergic rhinitis due to animal (cat) (dog) hair and dander: Secondary | ICD-10-CM | POA: Diagnosis not present

## 2015-06-16 DIAGNOSIS — J3089 Other allergic rhinitis: Secondary | ICD-10-CM | POA: Diagnosis not present

## 2015-06-16 DIAGNOSIS — R05 Cough: Secondary | ICD-10-CM | POA: Diagnosis not present

## 2015-06-16 MED FILL — BREO ELLIPTA 100-25 MCG INH: 100-25 | 30 days supply | Qty: 60 | Fill #0

## 2015-06-16 MED FILL — LEVOCETIRIZINE 5 MG TABLET: 5 | 30 days supply | Qty: 30 | Fill #0

## 2015-06-22 DIAGNOSIS — J301 Allergic rhinitis due to pollen: Secondary | ICD-10-CM | POA: Diagnosis not present

## 2015-06-22 DIAGNOSIS — J3089 Other allergic rhinitis: Secondary | ICD-10-CM | POA: Diagnosis not present

## 2015-06-22 DIAGNOSIS — J3081 Allergic rhinitis due to animal (cat) (dog) hair and dander: Secondary | ICD-10-CM | POA: Diagnosis not present

## 2015-06-24 MED FILL — MONTELUKAST SOD 10 MG TAB: 10 | 30 days supply | Qty: 30 | Fill #0

## 2015-07-08 MED FILL — EPINEPHRINE 0.3 MG AUTO-INJ: 0.3 | 30 days supply | Qty: 2 | Fill #0

## 2015-07-10 ENCOUNTER — Telehealth: Payer: 59 | Admitting: Family

## 2015-07-10 DIAGNOSIS — N390 Urinary tract infection, site not specified: Secondary | ICD-10-CM | POA: Diagnosis not present

## 2015-07-10 MED ORDER — SULFAMETHOXAZOLE-TRIMETHOPRIM 800-160 MG PO TABS: 1.0000 | ORAL_TABLET | Freq: Two times a day (BID) | ORAL | Status: DC

## 2015-07-10 MED FILL — SULFAMETHOXAZOLE/TMP DS TAB: 800-160 | 5 days supply | Qty: 10 | Fill #0

## 2015-07-10 NOTE — Progress Notes (Signed)

## 2015-07-17 DIAGNOSIS — L509 Urticaria, unspecified: Secondary | ICD-10-CM | POA: Diagnosis not present

## 2015-07-17 MED FILL — predniSONE 10 MG TABS: 10 | 6 days supply | Qty: 21 | Fill #0

## 2015-07-21 MED FILL — LEVOCETIRIZINE 5 MG TABLET: 5 | 30 days supply | Qty: 30 | Fill #1

## 2015-07-26 DIAGNOSIS — R300 Dysuria: Secondary | ICD-10-CM | POA: Diagnosis not present

## 2015-08-05 DIAGNOSIS — J3089 Other allergic rhinitis: Secondary | ICD-10-CM | POA: Diagnosis not present

## 2015-08-05 DIAGNOSIS — J3081 Allergic rhinitis due to animal (cat) (dog) hair and dander: Secondary | ICD-10-CM | POA: Diagnosis not present

## 2015-08-05 DIAGNOSIS — J301 Allergic rhinitis due to pollen: Secondary | ICD-10-CM | POA: Diagnosis not present

## 2015-08-13 DIAGNOSIS — J3089 Other allergic rhinitis: Secondary | ICD-10-CM | POA: Diagnosis not present

## 2015-08-13 DIAGNOSIS — J301 Allergic rhinitis due to pollen: Secondary | ICD-10-CM | POA: Diagnosis not present

## 2015-08-13 DIAGNOSIS — J3081 Allergic rhinitis due to animal (cat) (dog) hair and dander: Secondary | ICD-10-CM | POA: Diagnosis not present

## 2015-08-19 DIAGNOSIS — J3089 Other allergic rhinitis: Secondary | ICD-10-CM | POA: Diagnosis not present

## 2015-08-19 DIAGNOSIS — J301 Allergic rhinitis due to pollen: Secondary | ICD-10-CM | POA: Diagnosis not present

## 2015-08-21 MED FILL — MONTELUKAST SOD 10 MG TAB: 10 | 30 days supply | Qty: 30 | Fill #1

## 2015-08-21 MED FILL — LEVOCETIRIZINE 5 MG TABLET: 5 | 30 days supply | Qty: 30 | Fill #2

## 2015-08-26 DIAGNOSIS — J301 Allergic rhinitis due to pollen: Secondary | ICD-10-CM | POA: Diagnosis not present

## 2015-08-26 DIAGNOSIS — J3081 Allergic rhinitis due to animal (cat) (dog) hair and dander: Secondary | ICD-10-CM | POA: Diagnosis not present

## 2015-08-26 DIAGNOSIS — J3089 Other allergic rhinitis: Secondary | ICD-10-CM | POA: Diagnosis not present

## 2015-09-03 DIAGNOSIS — J301 Allergic rhinitis due to pollen: Secondary | ICD-10-CM | POA: Diagnosis not present

## 2015-09-03 DIAGNOSIS — J3081 Allergic rhinitis due to animal (cat) (dog) hair and dander: Secondary | ICD-10-CM | POA: Diagnosis not present

## 2015-09-03 DIAGNOSIS — J3089 Other allergic rhinitis: Secondary | ICD-10-CM | POA: Diagnosis not present

## 2015-09-05 ENCOUNTER — Observation Stay (HOSPITAL_COMMUNITY)
Admission: EM | Admit: 2015-09-05 | Discharge: 2015-09-06 | Disposition: A | Discharge status: Home / Self Care | Payer: 59 | Attending: Urology | Admitting: Urology

## 2015-09-05 ENCOUNTER — Encounter (HOSPITAL_COMMUNITY): Payer: Self-pay

## 2015-09-05 ENCOUNTER — Emergency Department (HOSPITAL_COMMUNITY): Payer: 59

## 2015-09-05 DIAGNOSIS — Z880 Allergy status to penicillin: Secondary | ICD-10-CM | POA: Diagnosis not present

## 2015-09-05 DIAGNOSIS — R1031 Right lower quadrant pain: Secondary | ICD-10-CM | POA: Diagnosis not present

## 2015-09-05 DIAGNOSIS — N23 Unspecified renal colic: Secondary | ICD-10-CM | POA: Diagnosis present

## 2015-09-05 DIAGNOSIS — R1110 Vomiting, unspecified: Secondary | ICD-10-CM

## 2015-09-05 DIAGNOSIS — N200 Calculus of kidney: Principal | ICD-10-CM | POA: Insufficient documentation

## 2015-09-05 DIAGNOSIS — N132 Hydronephrosis with renal and ureteral calculous obstruction: Secondary | ICD-10-CM | POA: Diagnosis not present

## 2015-09-05 DIAGNOSIS — N202 Calculus of kidney with calculus of ureter: Secondary | ICD-10-CM | POA: Diagnosis not present

## 2015-09-05 DIAGNOSIS — N209 Urinary calculus, unspecified: Secondary | ICD-10-CM

## 2015-09-05 DIAGNOSIS — R112 Nausea with vomiting, unspecified: Secondary | ICD-10-CM | POA: Diagnosis not present

## 2015-09-05 HISTORY — DX: Calculus of kidney: N20.0

## 2015-09-05 LAB — BASIC METABOLIC PANEL
Anion gap: 7 (ref 5–15)
BUN: 12 mg/dL (ref 6–20)
CALCIUM: 9.3 mg/dL (ref 8.9–10.3)
CO2: 21 mmol/L — ABNORMAL LOW (ref 22–32)
CREATININE: 0.93 mg/dL (ref 0.44–1.00)
Chloride: 111 mmol/L (ref 101–111)
GFR calc Af Amer: >60 mL/min (ref >60)
GLUCOSE: 115 mg/dL — AB (ref 65–99)
POTASSIUM: 3.8 mmol/L (ref 3.5–5.1)
SODIUM: 139 mmol/L (ref 135–145)

## 2015-09-05 LAB — CBC WITH DIFFERENTIAL/PLATELET
BASOS ABS: 0 10*3/uL (ref 0.0–0.1)
BASOS PCT: 0 %
EOS ABS: 0.3 10*3/uL (ref 0.0–0.7)
EOS PCT: 3 %
HCT: 36 % (ref 36.0–46.0)
Hemoglobin: 12.5 g/dL (ref 12.0–15.0)
Lymphocytes Relative: 24 %
Lymphs Abs: 1.9 10*3/uL (ref 0.7–4.0)
MCH: 30.4 pg (ref 26.0–34.0)
MCHC: 34.7 g/dL (ref 30.0–36.0)
MCV: 87.6 fL (ref 78.0–100.0)
MONO ABS: 0.3 10*3/uL (ref 0.1–1.0)
Monocytes Relative: 4 %
Neutro Abs: 5.4 10*3/uL (ref 1.7–7.7)
Neutrophils Relative %: 69 %
PLATELETS: 223 10*3/uL (ref 150–400)
RBC: 4.11 MIL/uL (ref 3.87–5.11)
RDW: 13.2 % (ref 11.5–15.5)
WBC: 7.9 10*3/uL (ref 4.0–10.5)

## 2015-09-05 LAB — URINALYSIS, ROUTINE W REFLEX MICROSCOPIC
Bilirubin Urine: NEGATIVE
GLUCOSE, UA: NEGATIVE mg/dL
KETONES UR: NEGATIVE mg/dL
Leukocytes, UA: NEGATIVE
Nitrite: NEGATIVE
PROTEIN: NEGATIVE mg/dL
Specific Gravity, Urine: 1.027 (ref 1.005–1.030)
pH: 6.5 (ref 5.0–8.0)

## 2015-09-05 LAB — URINE MICROSCOPIC-ADD ON

## 2015-09-05 MED ORDER — TAMSULOSIN HCL 0.4 MG PO CAPS
0.4000 mg | ORAL_CAPSULE | Freq: Once | ORAL | Status: AC
  Administered 2015-09-06: 0.4 mg via ORAL
  Filled 2015-09-05 (×2): qty 1

## 2015-09-05 MED ORDER — ACETAMINOPHEN 325 MG PO TABS: 650.0000 mg | ORAL_TABLET | ORAL | Status: DC | PRN

## 2015-09-05 MED ORDER — HYDROMORPHONE HCL 1 MG/ML IJ SOLN
1.0000 mg | Freq: Once | INTRAMUSCULAR | Status: AC
  Administered 2015-09-05: 1 mg via INTRAVENOUS
  Filled 2015-09-05: qty 1

## 2015-09-05 MED ORDER — BELLADONNA ALKALOIDS-OPIUM 16.2-60 MG RE SUPP: 1.0000 | Freq: Four times a day (QID) | RECTAL | Status: DC | PRN

## 2015-09-05 MED ORDER — SODIUM CHLORIDE 0.9 % IV SOLN
INTRAVENOUS | Status: DC
  Administered 2015-09-05 – 2015-09-06 (×2): via INTRAVENOUS

## 2015-09-05 MED ORDER — ONDANSETRON HCL 4 MG/2ML IJ SOLN
4.0000 mg | Freq: Once | INTRAMUSCULAR | Status: AC
  Administered 2015-09-05: 4 mg via INTRAVENOUS
  Filled 2015-09-05: qty 2

## 2015-09-05 MED ORDER — NALOXONE HCL 0.4 MG/ML IJ SOLN: 0.4000 mg | INTRAMUSCULAR | Status: DC | PRN

## 2015-09-05 MED ORDER — HYDROMORPHONE HCL 1 MG/ML IJ SOLN
1.0000 mg | Freq: Once | INTRAMUSCULAR | Status: AC
Start: 2015-09-05 — End: 2015-09-05
  Administered 2015-09-05: 1 mg via INTRAVENOUS
  Filled 2015-09-05: qty 1

## 2015-09-05 MED ORDER — ONDANSETRON HCL 4 MG/2ML IJ SOLN
4.0000 mg | Freq: Four times a day (QID) | INTRAMUSCULAR | Status: DC | PRN
  Administered 2015-09-05 – 2015-09-06 (×2): 4 mg via INTRAVENOUS
  Filled 2015-09-05 (×3): qty 2

## 2015-09-05 MED ORDER — HYDROMORPHONE 1 MG/ML IV SOLN
INTRAVENOUS | Status: DC
  Administered 2015-09-05: 1.2 mg via INTRAVENOUS
  Administered 2015-09-06: 0.9 mg via INTRAVENOUS
  Administered 2015-09-06: 1.2 mg via INTRAVENOUS
  Filled 2015-09-05: qty 25

## 2015-09-05 MED ORDER — SODIUM CHLORIDE 0.9% FLUSH: 9.0000 mL | INTRAVENOUS | Status: DC | PRN

## 2015-09-05 MED ORDER — ONDANSETRON HCL 4 MG/2ML IJ SOLN: 4.0000 mg | INTRAMUSCULAR | Status: DC | PRN

## 2015-09-05 MED ORDER — KETOROLAC TROMETHAMINE 30 MG/ML IJ SOLN
30.0000 mg | Freq: Four times a day (QID) | INTRAMUSCULAR | Status: DC
  Administered 2015-09-05 – 2015-09-06 (×3): 30 mg via INTRAVENOUS
  Filled 2015-09-05 (×3): qty 1

## 2015-09-05 MED ORDER — KETOROLAC TROMETHAMINE 30 MG/ML IJ SOLN
30.0000 mg | Freq: Once | INTRAMUSCULAR | Status: AC
  Administered 2015-09-05: 30 mg via INTRAVENOUS
  Filled 2015-09-05: qty 1

## 2015-09-05 MED ORDER — DIPHENHYDRAMINE HCL 50 MG/ML IJ SOLN
12.5000 mg | Freq: Four times a day (QID) | INTRAMUSCULAR | Status: DC | PRN
  Filled 2015-09-05: qty 1

## 2015-09-05 MED ORDER — SODIUM CHLORIDE 0.9 % IV SOLN
INTRAVENOUS | Status: DC
  Administered 2015-09-05: 12:00:00 via INTRAVENOUS

## 2015-09-05 MED ORDER — DIPHENHYDRAMINE HCL 12.5 MG/5ML PO ELIX
12.5000 mg | ORAL_SOLUTION | Freq: Four times a day (QID) | ORAL | Status: DC | PRN
  Filled 2015-09-05: qty 5

## 2015-09-05 MED ORDER — PROMETHAZINE HCL 25 MG/ML IJ SOLN
12.5000 mg | Freq: Once | INTRAMUSCULAR | Status: AC
  Administered 2015-09-05: 12.5 mg via INTRAVENOUS
  Filled 2015-09-05: qty 1

## 2015-09-05 NOTE — ED Provider Notes (Addendum)
CSN: [id number]     Arrival date & time 09/05/15  1059 History   First MD Initiated Contact with Patient 09/05/15 1116     Chief Complaint  Patient presents with  . Flank Pain  . Groin Pain     (Consider location/radiation/quality/duration/timing/severity/associated sxs/prior Treatment) HPI Onset of right flank pain yesterday. Has become much more severe today. Radiates from flank to groin. History of similar pain with kidney stone. No nausea. No vomiting. No diarrhea. No abnormal vaginal discharge or bleeding. Patient has had bilateral tubal ligation and uterine ablation. The pain burning or urgency urination. No blood in urine noted. Was well until symptoms onset. Past Medical History  Diagnosis Date  . Renal disorder   . Depression   . Kidney calculus     multiple stone HX   Past Surgical History  Procedure Laterality Date  . Lithotripsy  2004  . Cystoscopy w/ ureteroscopy    . Tubal ligation    . Ablation    . Holmium laser application  04/09/2012    Procedure: HOLMIUM LASER APPLICATION;  Surgeon: Marcine MatarStephen Dahlstedt, MD;  [facility] [place];  Service: Urology;  Laterality: Right;  . Cystoscopy with retrograde pyelogram, ureteroscopy and stent placement  04/09/2012    Procedure: CYSTOSCOPY WITH RETROGRADE PYELOGRAM, URETEROSCOPY AND STENT PLACEMENT;  Surgeon: Marcine MatarStephen Dahlstedt, MD;  [facility] ;  Service: Urology;  Laterality: Right;   No family history on file. Social History  Substance Use Topics  . Smoking status: Never Smoker   . Smokeless tobacco: None  . Alcohol Use: No   OB History    No data available     Review of Systems  10 Systems reviewed and are negative for acute change except as noted in the HPI.   Allergies  Penicillins and Sulfa antibiotics  Home Medications   Prior to Admission medications   Medication Sig Start Date End Date Taking? Authorizing Provider  DULoxetine (CYMBALTA) 60 MG capsule Take 120 mg by  mouth daily.    Historical Provider, MD  fexofenadine-pseudoephedrine (ALLEGRA-D 24) 180-240 MG per 24 hr tablet Take 1 tablet by mouth daily.    Historical Provider, MD  HYDROcodone-acetaminophen (NORCO/VICODIN) 5-325 MG per tablet Take 1 tablet by mouth every 6 (six) hours as needed. 05/10/13   Shon Batonourtney F Horton, MD  ibuprofen (ADVIL,MOTRIN) 600 MG tablet Take 1 tablet (600 mg total) by mouth every 6 (six) hours as needed. 05/10/13   Shon Batonourtney F Horton, MD  lamoTRIgine (LAMICTAL) 100 MG tablet Take 100 mg by mouth 2 (two) times daily.     Historical Provider, MD  montelukast (SINGULAIR) 10 MG tablet Take 10 mg by mouth at bedtime.    Historical Provider, MD  sulfamethoxazole-trimethoprim (BACTRIM DS,SEPTRA DS) 800-160 MG tablet Take 1 tablet by mouth 2 (two) times daily. 07/10/15   Everardo AllJohn C Withrow, FNP   BP 124/79 mmHg  Pulse 65  Temp(Src) 97.8 F (36.6 C) (Oral)  Resp 16  Ht 5\' 8"  (1.727 m)  Wt 230 lb (104.327 kg)  BMI 34.98 kg/m2  SpO2 99% Physical Exam  Constitutional: She is oriented to person, place, and time.  Patient is to be in severe pain. She is alert and nontoxic.  No respiratory distress.  HENT:  Head: Normocephalic and atraumatic.  Eyes: EOM are normal. No scleral icterus.  Cardiovascular: Normal rate, regular rhythm and normal heart sounds.   Pulmonary/Chest: Effort normal and breath sounds normal.  Abdominal: Soft. She exhibits no distension and no  mass. There is no tenderness. There is no rebound and no guarding.  No CVA percussion tenderness.  Musculoskeletal: Normal range of motion. She exhibits no edema or tenderness.  Neurological: She is alert and oriented to person, place, and time. She exhibits normal muscle tone. Coordination normal.  Skin: Skin is warm and dry.  Psychiatric: She has a normal mood and affect.    ED Course  Procedures (including critical care time) Labs Review Labs Reviewed  BASIC METABOLIC PANEL - Abnormal; Notable for the following:    CO2 21  (*)    Glucose, Bld 115 (*)    All other components within normal limits  CBC WITH DIFFERENTIAL/PLATELET  URINALYSIS, ROUTINE W REFLEX MICROSCOPIC (NOT AT [facility])    Imaging Review Ct Renal Stone Study  09/05/2015  CLINICAL DATA:  Severe right flank pain and lower pelvic pain for 1 day. Nausea. Nephrolithiasis. EXAM: CT ABDOMEN AND PELVIS WITHOUT CONTRAST TECHNIQUE: Multidetector CT imaging of the abdomen and pelvis was performed following the standard protocol without IV contrast. COMPARISON:  04/03/2012 FINDINGS: Lower chest:  No acute findings. Hepatobiliary: Hepatic steatosis again noted with focal fatty infiltration adjacent to gallbladder fossa. Gallbladder is unremarkable. Pancreas: No mass or inflammatory process identified on this un-enhanced exam. Spleen: Within normal limits in size. Adrenals/Urinary Tract: Normal adrenal glands. Multiple small less than 1 cm intrarenal calculi are again seen bilaterally. Moderate right hydronephrosis and ureterectasis shows no significant change compared to prior study. No evidence of perinephric fluid or inflammatory changes. Two adjacent calculi are again seen in the distal right ureter, largest measuring 5 mm. These are stable in location since prior exam. Stomach/Bowel: No evidence of obstruction, inflammatory process, or abnormal fluid collections. Normal appendix visualized. Mild colonic diverticulosis, without evidence of diverticulitis. Vascular/Lymphatic: No pathologically enlarged lymph nodes. No evidence of abdominal aortic aneurysm. Reproductive: No mass or other significant abnormality. Other: None. Musculoskeletal:  No suspicious bone lesions identified. IMPRESSION: Moderate right hydronephrosis and ureterectasis due to 2 adjacent distal right ureteral calculi, largest measuring 5 mm. This has not simply changed since previous study in 2014. Bilateral nephrolithiasis. Electronically Signed   By: Myles RosenthalJohn  Stahl M.D.   On: 09/05/2015 11:56   I have  personally reviewed and evaluated these images and lab results as part of my medical decision-making.   EKG Interpretation None     Consult: (12:35) discussed with Dr. Vernie Ammonsttelin, if pain can be managed, will see in office Monday. MDM   Final diagnoses:  Right kidney stone  Intractable vomiting with nausea, vomiting of unspecified type   Patient presents with right ureterovesicar a kidney stone. Patient has intractable pain with nausea and vomiting. At this time, patient will be admitted to Dr. Vernie Ammonsttelin. Reviewing Dr. Margrett Rudttelin's note, plan will be to [facility] and [facility] should the patient not respond to pain management therapy.    Arby BarretteMarcy Yuleni Burich, MD 09/05/15 1533  Arby BarretteMarcy Leonor Darnell, MD 09/05/15 [phone]

## 2015-09-05 NOTE — H&P (Signed)
Monica Williams is an 40 y.o. female.  Assessment: Intractable nausea, vomiting and pain secondary to a distal right ureteral stone measuring 4.5 mm with associated hydronephrosis. Today we discussed the management of obstructing urinary stones. These options include observation with pain control, percutaneous nephrostomy tube, or cystoscopy with retrograde ureteral stent placement with possible ureteroscopy and laser lithotripsy.  We discussed the risks, benefits, alternatives and likelihood of achieving the patient's goals.  We discussed which options are relevant to this particular situation. We discussed the natural history of stones and the as well as the complications of untreated stones and the impact on quality of life without treatment as well as with each of the above listed treatments. We also discussed the efficacy of each treatment. With any of these management options I discussed the signs and symptoms of infection and the need for emergent treatment should these be experienced.   For observation I described the risks which include, but are not limited to, silent renal damage, life-threatening infection, need for emergent surgery, failure to pass stone, and pain.  For stent placement with or without ureteroscopy and lithotripsy I described the risks which include, but are not limited to, heart attack, stroke, pulmonary embolus, death, bleeding, infection, damage to contiguous structures, positioning injury, ureteral stricture, ureteral avulsion, ureteral injury, need for ureteral stent, inability to perform ureteroscopy, need for an interval procedure, inability to clear stone burden, stent discomfort and pain.  She would like to proceed conservatively initially. We discussed the use of tamsulosin for medical expulsive therapy and this will be initiated. If she does not respond to medical management she would like to proceed with surgical intervention. This would necessitate transfer to [facility] for the surgery.  Plan: 1. Admit for pain control and antiemetics. 2. Strain all urine. 3. Begin Flomax. 4. PCA pain control. 5. Reassessment in the morning and nothing by mouth after midnight.     HPI: Monica Williams is a 40 year old female patient who presented to the emergency room having developed acute onset right lower quadrant/suprapubic pain last night. It progressed and she presented to the emergency room. There she was evaluated with a CT scan which revealed a 4.5 mm stone in the distal right ureter with Hounsfield units of 800 and some associated hydronephrosis. The study also revealed bilateral, nonobstructing renal calculi. She was given pain medication and antiemetics but her pain and nausea have not been controlled and she is therefore being admitted for pain control and control of her nausea with consideration of ureteroscopic management of her stone. She indicates the pain is similar to the pain she has had previously with stones. It is much more severe this time however and she also reports that she's not had any change in her voiding pattern or hematuria. Her pain is not relieved by positional change. It has been associated with vomiting. She has undergone multiple previous urologic surgeries for her stones including lithotripsy and ureteroscopy in the past.  Past Medical History  Diagnosis Date  . Renal disorder   . Depression   . Kidney calculus     multiple stone HX    Past Surgical History  Procedure Laterality Date  . Lithotripsy  2004  . Cystoscopy w/ ureteroscopy    . Tubal ligation    . Ablation    . Holmium laser application  11/08/8544    Procedure: HOLMIUM LASER APPLICATION;  Surgeon: Franchot Gallo, MD;  Location: [facility];  Service: Urology;  Laterality: Right;  .  Cystoscopy with retrograde pyelogram, ureteroscopy and stent placement  04/09/2012    Procedure: CYSTOSCOPY WITH RETROGRADE PYELOGRAM, URETEROSCOPY AND STENT PLACEMENT;   Surgeon: Franchot Gallo, MD;  Location: [facility];  Service: Urology;  Laterality: Right;     (Not in a hospital admission)  Allergies:  Allergies  Allergen Reactions  . Penicillins Hives  . Sulfa Antibiotics Hives    No family history on file.  Social History:  reports that she has never smoked. She does not have any smokeless tobacco history on file. She reports that she does not drink alcohol or use illicit drugs.  Review of Systems: Pertinent items are noted in HPI. A comprehensive review of systems was negative except as noted above.  Results for orders placed or performed during the hospital encounter of 09/05/15 (from the past 48 hour(s))  Basic metabolic panel     Status: Abnormal   Collection Time: 09/05/15 11:27 AM  Result Value Ref Range   Sodium 139 135 - 145 mmol/L   Potassium 3.8 3.5 - 5.1 mmol/L   Chloride 111 101 - 111 mmol/L   CO2 21 (L) 22 - 32 mmol/L   Glucose, Bld 115 (H) 65 - 99 mg/dL   BUN 12 6 - 20 mg/dL   Creatinine, Ser 0.93 0.44 - 1.00 mg/dL   Calcium 9.3 8.9 - 10.3 mg/dL   GFR calc non Af Amer >60 >60 mL/min   GFR calc Af Amer >60 >60 mL/min    Comment: (NOTE) The eGFR has been calculated using the CKD EPI equation. This calculation has not been validated in all clinical situations. eGFR's persistently <60 mL/min signify possible Chronic Kidney Disease.    Anion gap 7 5 - 15  CBC with Differential     Status: None   Collection Time: 09/05/15 11:27 AM  Result Value Ref Range   WBC 7.9 4.0 - 10.5 K/uL   RBC 4.11 3.87 - 5.11 MIL/uL   Hemoglobin 12.5 12.0 - 15.0 g/dL   HCT 36.0 36.0 - 46.0 %   MCV 87.6 78.0 - 100.0 fL   MCH 30.4 26.0 - 34.0 pg   MCHC 34.7 30.0 - 36.0 g/dL   RDW 13.2 11.5 - 15.5 %   Platelets 223 150 - 400 K/uL   Neutrophils Relative % 69 %   Neutro Abs 5.4 1.7 - 7.7 K/uL   Lymphocytes Relative 24 %   Lymphs Abs 1.9 0.7 - 4.0 K/uL   Monocytes Relative 4 %   Monocytes Absolute 0.3 0.1 - 1.0 K/uL    Eosinophils Relative 3 %   Eosinophils Absolute 0.3 0.0 - 0.7 K/uL   Basophils Relative 0 %   Basophils Absolute 0.0 0.0 - 0.1 K/uL    Ct Renal Stone Study  09/05/2015  CLINICAL DATA:  Severe right flank pain and lower pelvic pain for 1 day. Nausea. Nephrolithiasis. EXAM: CT ABDOMEN AND PELVIS WITHOUT CONTRAST TECHNIQUE: Multidetector CT imaging of the abdomen and pelvis was performed following the standard protocol without IV contrast. COMPARISON:  04/03/2012 FINDINGS: Lower chest:  No acute findings. Hepatobiliary: Hepatic steatosis again noted with focal fatty infiltration adjacent to gallbladder fossa. Gallbladder is unremarkable. Pancreas: No mass or inflammatory process identified on this un-enhanced exam. Spleen: Within normal limits in size. Adrenals/Urinary Tract: Normal adrenal glands. Multiple small less than 1 cm intrarenal calculi are again seen bilaterally. Moderate right hydronephrosis and ureterectasis shows no significant change compared to prior study. No evidence of perinephric fluid or inflammatory changes. Two  adjacent calculi are again seen in the distal right ureter, largest measuring 5 mm. These are stable in location since prior exam. Stomach/Bowel: No evidence of obstruction, inflammatory process, or abnormal fluid collections. Normal appendix visualized. Mild colonic diverticulosis, without evidence of diverticulitis. Vascular/Lymphatic: No pathologically enlarged lymph nodes. No evidence of abdominal aortic aneurysm. Reproductive: No mass or other significant abnormality. Other: None. Musculoskeletal:  No suspicious bone lesions identified. IMPRESSION: Moderate right hydronephrosis and ureterectasis due to 2 adjacent distal right ureteral calculi, largest measuring 5 mm. This has not simply changed since previous study in 2014. Bilateral nephrolithiasis. Electronically Signed   By: Earle Gell M.D.   On: 09/05/2015 11:56    Temp:  [97.8 F (36.6 C)] 97.8 F (36.6 C) (07/01  1105) Pulse Rate:  [56-88] 56 (07/01 1230) Resp:  [16-26] 16 (07/01 1157) BP: (113-160)/(82-103) 118/82 mmHg (07/01 1230) SpO2:  [94 %-100 %] 94 % (07/01 1230) Weight:  [104.327 kg (230 lb)] 104.327 kg (230 lb) (07/01 1105)  Physical Exam: General appearance: alert and appears stated age Head: Normocephalic, without obvious abnormality, atraumatic Eyes: conjunctivae/corneas clear. EOM's intact.  Oropharynx: moist mucous membranes Neck: supple, symmetrical, trachea midline Resp: normal respiratory effort Cardio: regular rate and rhythm Back: symmetric, no curvature. ROM normal. No CVA tenderness. GI: soft, non-tender; bowel sounds normal; no masses,  no organomegaly Pelvic: deferred Extremities: extremities normal, atraumatic, no cyanosis or edema Skin: Skin color normal. No visible rashes or lesions Neurologic: Grossly normal     Chardonnay Holzmann C 09/05/2015, 2:35 PM

## 2015-09-05 NOTE — ED Notes (Addendum)
Patient complains of severe lower pelvic pain with radiation to back x 1 day. Nausea with same. Hyperventilating and restless on arrival

## 2015-09-05 NOTE — Progress Notes (Signed)
Pt admitted to the unit at 1736. Pt mental status is alert and oriented x 4. Pt oriented to room, staff, and call bell. Skin is intact. Full assessment charted in [facility]. Call bell within reach. Visitor guidelines reviewed w/ pt and/or family.

## 2015-09-06 DIAGNOSIS — R112 Nausea with vomiting, unspecified: Secondary | ICD-10-CM | POA: Diagnosis not present

## 2015-09-06 DIAGNOSIS — Z880 Allergy status to penicillin: Secondary | ICD-10-CM | POA: Diagnosis not present

## 2015-09-06 DIAGNOSIS — N132 Hydronephrosis with renal and ureteral calculous obstruction: Secondary | ICD-10-CM | POA: Diagnosis not present

## 2015-09-06 DIAGNOSIS — N200 Calculus of kidney: Secondary | ICD-10-CM | POA: Diagnosis not present

## 2015-09-06 DIAGNOSIS — N202 Calculus of kidney with calculus of ureter: Secondary | ICD-10-CM | POA: Diagnosis not present

## 2015-09-06 DIAGNOSIS — R1031 Right lower quadrant pain: Secondary | ICD-10-CM | POA: Diagnosis not present

## 2015-09-06 MED ORDER — TAMSULOSIN HCL 0.4 MG PO CAPS: 0.4000 mg | ORAL_CAPSULE | ORAL | Status: DC

## 2015-09-06 MED ORDER — HYDROCODONE-ACETAMINOPHEN 10-325 MG PO TABS: 1.0000 | ORAL_TABLET | ORAL | Status: DC | PRN

## 2015-09-06 NOTE — Progress Notes (Signed)
Patient discharge teaching given, including activity, diet, follow-up appoints, and medications. Patient verbalized understanding of all discharge instructions. IV access was d/c'd. Vitals are stable. Skin is intact except as charted in most recent assessments. Pt to be escorted out by NT, to be driven home by family. 

## 2015-09-06 NOTE — Discharge Summary (Signed)
Physician Discharge Summary  Patient ID: Emilia BeckMelody A Sonnenberg MRN: [id number] DOB/AGE: 42/08/1975 42 y.o.  Admit date: 09/05/2015 Discharge date: 09/06/2015  Admission Diagnoses: Right kidney stone [N20.0] Intractable vomiting with nausea, vomiting of unspecified type [R11.10]  Discharge Diagnoses:  Active Problems:   Kidney stone   Renal colic on right side   Discharged Condition: good  Hospital Course: She was admitted and placed on intravenous fluids, antiemetics and narcotic as well as nonnarcotic analgesics. This resulted in resolution of her pain as well as nausea and vomiting. By the following morning she was feeling much better with no pain or nausea at that time. We had a discussion about the options and she has undergone lithotripsy, ureteroscopy and medical expulsive therapy in the past. I told her that I would be more than willing to take her to the operating room this morning for ureteroscopic stone removal although she stated she preferred lithotripsy if possible. Her stone is located at the lower border of her sacrum and I have marked the plain film reconstruction of her CT scan with an arrow indicating the location of the stone in preparation for lithotripsy which she has elected to proceed with at this time. I will maintain her on medical expulsive therapy in the hopes to her stone will pass spontaneously as she has passed multiple stones previously. If not she will be scheduled for lithotripsy of her right distal ureteral stone. She will be discharged on tamsulosin and hydrocodone which she said works the best in controlling her pain.  Significant Diagnostic Studies: Ct Renal Stone Study  09/05/2015  CLINICAL DATA:  Severe right flank pain and lower pelvic pain for 1 day. Nausea. Nephrolithiasis. EXAM: CT ABDOMEN AND PELVIS WITHOUT CONTRAST TECHNIQUE: Multidetector CT imaging of the abdomen and pelvis was performed following the standard protocol without IV contrast. COMPARISON:   04/03/2012 FINDINGS: Lower chest:  No acute findings. Hepatobiliary: Hepatic steatosis again noted with focal fatty infiltration adjacent to gallbladder fossa. Gallbladder is unremarkable. Pancreas: No mass or inflammatory process identified on this un-enhanced exam. Spleen: Within normal limits in size. Adrenals/Urinary Tract: Normal adrenal glands. Multiple small less than 1 cm intrarenal calculi are again seen bilaterally. Moderate right hydronephrosis and ureterectasis shows no significant change compared to prior study. No evidence of perinephric fluid or inflammatory changes. Two adjacent calculi are again seen in the distal right ureter, largest measuring 5 mm. These are stable in location since prior exam. Stomach/Bowel: No evidence of obstruction, inflammatory process, or abnormal fluid collections. Normal appendix visualized. Mild colonic diverticulosis, without evidence of diverticulitis. Vascular/Lymphatic: No pathologically enlarged lymph nodes. No evidence of abdominal aortic aneurysm. Reproductive: No mass or other significant abnormality. Other: None. Musculoskeletal:  No suspicious bone lesions identified. IMPRESSION: Moderate right hydronephrosis and ureterectasis due to 2 adjacent distal right ureteral calculi, largest measuring 5 mm. This has not simply changed since previous study in 2014. Bilateral nephrolithiasis. Electronically Signed   By: Myles RosenthalJohn  Stahl M.D.   On: 09/05/2015 11:56    Discharge Exam: Blood pressure 115/70, pulse 80, temperature 97.8 F (36.6 C), temperature source Oral, resp. rate 18, height 5\' 8"  (1.727 m), weight 104.327 kg (230 lb), SpO2 99 %. Alert, oriented and in no apparent distress. No CVAT noted.  Disposition: 01-Home or Self Care  Discharge Instructions    Discharge patient    Complete by:  As directed             Medication List    TAKE these medications  EPINEPHrine 0.3 mg/0.3 mL Soaj injection  Commonly known as:  EPI-PEN  Inject 0.3  mg into the muscle once.     HYDROcodone-acetaminophen 10-325 MG tablet  Commonly known as:  NORCO  Take 1-2 tablets by mouth every 4 (four) hours as needed for moderate pain. Maximum dose per 24 hours -8 pills.     ibuprofen 200 MG tablet  Commonly known as:  ADVIL,MOTRIN  Take 600-800 mg by mouth 2 (two) times daily as needed for moderate pain.     levocetirizine 5 MG tablet  Commonly known as:  XYZAL  Take 5 mg by mouth every evening.     montelukast 10 MG tablet  Commonly known as:  SINGULAIR  Take 10 mg by mouth at bedtime.     tamsulosin 0.4 MG Caps capsule  Commonly known as:  FLOMAX  Take 1 capsule (0.4 mg total) by mouth daily after supper.           Follow-up Information    Call [facility]   Why:  To schedule your lithotripsy   Contact information:   [address]  [place] [address] [place] [address] [phone]       Signed: Garnett FarmOTTELIN,Cary Lothrop C 09/06/2015, 7:41 AM

## 2015-09-07 ENCOUNTER — Other Ambulatory Visit: Payer: Self-pay | Admitting: Urology

## 2015-09-07 ENCOUNTER — Encounter: Payer: Self-pay | Admitting: Nurse Practitioner

## 2015-09-07 DIAGNOSIS — N201 Calculus of ureter: Secondary | ICD-10-CM | POA: Diagnosis not present

## 2015-09-07 DIAGNOSIS — R8271 Bacteriuria: Secondary | ICD-10-CM | POA: Diagnosis not present

## 2015-09-09 ENCOUNTER — Encounter (HOSPITAL_COMMUNITY): Payer: Self-pay | Admitting: *Deleted

## 2015-09-10 ENCOUNTER — Encounter (HOSPITAL_COMMUNITY)
Admission: RE | Disposition: A | Discharge status: Home / Self Care | Payer: Self-pay | Source: Ambulatory Visit | Attending: Urology

## 2015-09-10 ENCOUNTER — Ambulatory Visit (HOSPITAL_COMMUNITY)
Admission: RE | Admit: 2015-09-10 | Discharge: 2015-09-10 | Disposition: A | Discharge status: Home / Self Care | Payer: 59 | Source: Ambulatory Visit | Attending: Urology | Admitting: Urology

## 2015-09-10 ENCOUNTER — Ambulatory Visit (HOSPITAL_COMMUNITY): Payer: 59

## 2015-09-10 ENCOUNTER — Encounter (HOSPITAL_COMMUNITY): Payer: Self-pay | Admitting: General Practice

## 2015-09-10 DIAGNOSIS — N201 Calculus of ureter: Secondary | ICD-10-CM | POA: Insufficient documentation

## 2015-09-10 DIAGNOSIS — Z01818 Encounter for other preprocedural examination: Secondary | ICD-10-CM | POA: Diagnosis not present

## 2015-09-10 DIAGNOSIS — N202 Calculus of kidney with calculus of ureter: Secondary | ICD-10-CM | POA: Diagnosis not present

## 2015-09-10 DIAGNOSIS — Z79891 Long term (current) use of opiate analgesic: Secondary | ICD-10-CM | POA: Insufficient documentation

## 2015-09-10 DIAGNOSIS — Z79899 Other long term (current) drug therapy: Secondary | ICD-10-CM | POA: Insufficient documentation

## 2015-09-10 HISTORY — DX: Family history of other specified conditions: Z84.89

## 2015-09-10 HISTORY — DX: Headache: R51

## 2015-09-10 HISTORY — DX: Personal history of urinary calculi: Z87.442

## 2015-09-10 HISTORY — DX: Unspecified asthma, uncomplicated: J45.909

## 2015-09-10 HISTORY — DX: Headache, unspecified: R51.9

## 2015-09-10 SURGERY — LITHOTRIPSY, ESWL
Anesthesia: LOCAL | Laterality: Right

## 2015-09-10 MED ORDER — DIAZEPAM 5 MG PO TABS
10.0000 mg | ORAL_TABLET | ORAL | Status: AC
  Administered 2015-09-10: 10 mg via ORAL
  Filled 2015-09-10: qty 2

## 2015-09-10 MED ORDER — CIPROFLOXACIN HCL 500 MG PO TABS
500.0000 mg | ORAL_TABLET | ORAL | Status: AC
  Administered 2015-09-10: 500 mg via ORAL
  Filled 2015-09-10: qty 1

## 2015-09-10 MED ORDER — SODIUM CHLORIDE 0.9 % IV SOLN
INTRAVENOUS | Status: DC
  Administered 2015-09-10: 07:00:00 via INTRAVENOUS

## 2015-09-10 MED ORDER — DIPHENHYDRAMINE HCL 25 MG PO CAPS
25.0000 mg | ORAL_CAPSULE | ORAL | Status: AC
  Administered 2015-09-10: 25 mg via ORAL
  Filled 2015-09-10: qty 1

## 2016-01-25 MED FILL — AZITHROMYCIN 250 MG TABLET: 250 | 5 days supply | Qty: 6 | Fill #0

## 2016-01-29 ENCOUNTER — Emergency Department (HOSPITAL_COMMUNITY)
Admission: EM | Admit: 2016-01-29 | Discharge: 2016-01-29 | Disposition: A | Discharge status: Home / Self Care | Payer: 59 | Attending: Emergency Medicine | Admitting: Emergency Medicine

## 2016-01-29 ENCOUNTER — Emergency Department (HOSPITAL_COMMUNITY): Payer: 59

## 2016-01-29 ENCOUNTER — Encounter (HOSPITAL_COMMUNITY): Payer: Self-pay | Admitting: Emergency Medicine

## 2016-01-29 DIAGNOSIS — N201 Calculus of ureter: Secondary | ICD-10-CM | POA: Insufficient documentation

## 2016-01-29 DIAGNOSIS — N200 Calculus of kidney: Secondary | ICD-10-CM | POA: Diagnosis not present

## 2016-01-29 DIAGNOSIS — J45909 Unspecified asthma, uncomplicated: Secondary | ICD-10-CM | POA: Insufficient documentation

## 2016-01-29 DIAGNOSIS — N132 Hydronephrosis with renal and ureteral calculous obstruction: Secondary | ICD-10-CM | POA: Diagnosis not present

## 2016-01-29 DIAGNOSIS — R1030 Lower abdominal pain, unspecified: Secondary | ICD-10-CM | POA: Diagnosis present

## 2016-01-29 DIAGNOSIS — N1330 Unspecified hydronephrosis: Secondary | ICD-10-CM | POA: Insufficient documentation

## 2016-01-29 DIAGNOSIS — Z79899 Other long term (current) drug therapy: Secondary | ICD-10-CM | POA: Insufficient documentation

## 2016-01-29 DIAGNOSIS — N134 Hydroureter: Secondary | ICD-10-CM | POA: Diagnosis not present

## 2016-01-29 LAB — URINE MICROSCOPIC-ADD ON

## 2016-01-29 LAB — URINALYSIS, ROUTINE W REFLEX MICROSCOPIC
Bilirubin Urine: NEGATIVE
GLUCOSE, UA: NEGATIVE mg/dL
Ketones, ur: NEGATIVE mg/dL
LEUKOCYTES UA: NEGATIVE
Nitrite: NEGATIVE
PH: 6.5 (ref 5.0–8.0)
Protein, ur: NEGATIVE mg/dL
Specific Gravity, Urine: 1.019 (ref 1.005–1.030)

## 2016-01-29 LAB — COMPREHENSIVE METABOLIC PANEL
ALBUMIN: 4.2 g/dL (ref 3.5–5.0)
ALK PHOS: 67 U/L (ref 38–126)
ALT: 20 U/L (ref 14–54)
AST: 18 U/L (ref 15–41)
Anion gap: 6 (ref 5–15)
BILIRUBIN TOTAL: 0.7 mg/dL (ref 0.3–1.2)
BUN: 20 mg/dL (ref 6–20)
CALCIUM: 8.9 mg/dL (ref 8.9–10.3)
CO2: 25 mmol/L (ref 22–32)
CREATININE: 1.05 mg/dL — AB (ref 0.44–1.00)
Chloride: 108 mmol/L (ref 101–111)
GFR calc Af Amer: >60 mL/min (ref >60)
GFR calc non Af Amer: >60 mL/min (ref >60)
GLUCOSE: 110 mg/dL — AB (ref 65–99)
Potassium: 4.1 mmol/L (ref 3.5–5.1)
Sodium: 139 mmol/L (ref 135–145)
TOTAL PROTEIN: 7.3 g/dL (ref 6.5–8.1)

## 2016-01-29 LAB — CBC
HCT: 35.6 % — ABNORMAL LOW (ref 36.0–46.0)
Hemoglobin: 12.3 g/dL (ref 12.0–15.0)
MCH: 30.8 pg (ref 26.0–34.0)
MCHC: 34.6 g/dL (ref 30.0–36.0)
MCV: 89 fL (ref 78.0–100.0)
Platelets: 248 10*3/uL (ref 150–400)
RBC: 4 MIL/uL (ref 3.87–5.11)
RDW: 13.1 % (ref 11.5–15.5)
WBC: 9.5 10*3/uL (ref 4.0–10.5)

## 2016-01-29 LAB — LIPASE, BLOOD: Lipase: 20 U/L (ref 11–51)

## 2016-01-29 LAB — PREGNANCY, URINE: PREG TEST UR: NEGATIVE

## 2016-01-29 MED ORDER — KETOROLAC TROMETHAMINE 15 MG/ML IJ SOLN
15.0000 mg | Freq: Once | INTRAMUSCULAR | Status: AC
  Administered 2016-01-29: 15 mg via INTRAVENOUS
  Filled 2016-01-29: qty 1

## 2016-01-29 MED ORDER — HYDROCODONE-ACETAMINOPHEN 5-325 MG PO TABS: 1.0000 | ORAL_TABLET | ORAL | 0 refills | Status: DC | PRN

## 2016-01-29 MED ORDER — ONDANSETRON 4 MG PO TBDP
4.0000 mg | ORAL_TABLET | Freq: Once | ORAL | Status: AC | PRN
  Administered 2016-01-29: 4 mg via ORAL
  Filled 2016-01-29: qty 1

## 2016-01-29 MED ORDER — HYDROMORPHONE HCL 1 MG/ML IJ SOLN
1.0000 mg | Freq: Once | INTRAMUSCULAR | Status: AC
Start: 2016-01-29 — End: 2016-01-29
  Administered 2016-01-29: 1 mg via INTRAVENOUS
  Filled 2016-01-29: qty 1

## 2016-01-29 MED ORDER — PROMETHAZINE HCL 25 MG PO TABS: 25.0000 mg | ORAL_TABLET | Freq: Four times a day (QID) | ORAL | 0 refills | Status: DC | PRN

## 2016-01-29 MED ORDER — ONDANSETRON HCL 4 MG/2ML IJ SOLN
4.0000 mg | Freq: Once | INTRAMUSCULAR | Status: AC
  Administered 2016-01-29: 4 mg via INTRAVENOUS
  Filled 2016-01-29: qty 2

## 2016-01-29 MED ORDER — TAMSULOSIN HCL 0.4 MG PO CAPS: 0.4000 mg | ORAL_CAPSULE | Freq: Every day | ORAL | 0 refills | Status: DC

## 2016-01-29 MED ORDER — PROMETHAZINE HCL 25 MG/ML IJ SOLN
12.5000 mg | Freq: Once | INTRAMUSCULAR | Status: AC
  Administered 2016-01-29: 12.5 mg via INTRAVENOUS
  Filled 2016-01-29: qty 1

## 2016-01-29 NOTE — ED Provider Notes (Signed)
[facility]-EMERGENCY DEPT Provider Note   CSN: [id number] Arrival date & time: 01/29/16  1035     History   Chief Complaint Chief Complaint  Patient presents with  . Flank Pain    HPI Monica Williams is a 40 y.o. female.  40 yo Caucasian female with a past medical history significant for migraines, depression, nephrolithiasis that presents to the ED today with right-sided flank pain and lower abdominal pain. Patient states that approximately 3 days ago she started developing right flank pain that was intermittent and describe the pain as sharp and cramping. She states the pain radiated to her lower abdomen. The pain is intermittent but gradually worsening. She took some Vicodin she had a home that was helping until the nausea and vomiting started approximately one day ago. Patient states that she has had approximately half a dozen episodes of nonbloody, nonbilious emesis. Patient reports urgency and frequency but denies any dysuria or hematuria. She denies any vaginal symptoms including discharge or bleeding. Patient states that she had a kidney stone removed in this past July and this feels very similar. The pain was acute in onset. Nothing makes better or worse. She denies any fever, chills, headache, vision changes, lightheadedness, dizziness, cough, chest pain, shortness of breath, as long as he is very change in bowel habits, diarrhea, numbness/tingling.      Past Medical History:  Diagnosis Date  . Asthma    last attack 05/2015  . Depression   . Family history of adverse reaction to anesthesia    mother has nausea and difficulty waking up  . Headache    migraines  . History of kidney stones   . Kidney calculus    multiple stone HX  . Renal disorder     Patient Active Problem List   Diagnosis Date Noted  . Kidney stone 09/05/2015  . Renal colic on right side 09/05/2015    Past Surgical History:  Procedure Laterality Date  . ABLATION    . CYSTOSCOPY W/ URETEROSCOPY    .  CYSTOSCOPY WITH RETROGRADE PYELOGRAM, URETEROSCOPY AND STENT PLACEMENT  04/09/2012   Procedure: CYSTOSCOPY WITH RETROGRADE PYELOGRAM, URETEROSCOPY AND STENT PLACEMENT;  Surgeon: Marcine MatarStephen Dahlstedt, MD;  [facility];  Service: Urology;  Laterality: Right;  . HOLMIUM LASER APPLICATION  04/09/2012   Procedure: HOLMIUM LASER APPLICATION;  Surgeon: Marcine MatarStephen Dahlstedt, MD;  [facility] ;  Service: Urology;  Laterality: Right;  . LITHOTRIPSY  2004  . TUBAL LIGATION  2005    OB History    No data available       Home Medications    Prior to Admission medications   Medication Sig Start Date End Date Taking? Authorizing Provider  albuterol (PROVENTIL HFA;VENTOLIN HFA) 108 (90 Base) MCG/ACT inhaler Inhale into the lungs every 6 (six) hours as needed for wheezing or shortness of breath.   Yes Historical Provider, MD  azithromycin (ZITHROMAX) 250 MG tablet Take 250-500 mg by mouth daily. Take 500mg  by mouth on day 1, then 250mg  daily x 4 days. Started on 11/20 01/25/16  Yes Historical Provider, MD  EPINEPHrine 0.3 mg/0.3 mL IJ SOAJ injection Inject 0.3 mg into the muscle once.  07/08/15  Yes Historical Provider, MD  fluticasone furoate-vilanterol (BREO ELLIPTA) 100-25 MCG/INH AEPB Inhale 1 puff into the lungs daily.   Yes Historical Provider, MD  HYDROcodone-acetaminophen (NORCO) 10-325 MG tablet Take 1-2 tablets by mouth every 4 (four) hours as needed for moderate pain. Maximum dose per 24 hours -  8 pills. 09/06/15  Yes Ihor Gully, MD  levocetirizine (XYZAL) 5 MG tablet Take 5 mg by mouth every evening.   Yes Historical Provider, MD  montelukast (SINGULAIR) 10 MG tablet Take 10 mg by mouth at bedtime.   Yes Historical Provider, MD  tamsulosin (FLOMAX) 0.4 MG CAPS capsule Take 1 capsule (0.4 mg total) by mouth daily after supper. 09/06/15  Yes Ihor Gully, MD  SUMAtriptan (IMITREX) 25 MG tablet Take 25 mg by mouth every 2 (two) hours as needed for migraine. May  repeat in 2 hours if headache persists or recurs.    Historical Provider, MD    Family History No family history on file.  Social History Social History  Substance Use Topics  . Smoking status: Never Smoker  . Smokeless tobacco: Not on file  . Alcohol use No     Allergies   Penicillins and Sulfa antibiotics   Review of Systems Review of Systems  Constitutional: Negative for chills and fever.  HENT: Negative for congestion.   Eyes: Negative for photophobia and visual disturbance.  Respiratory: Negative for cough and shortness of breath.   Cardiovascular: Negative for chest pain and palpitations.  Gastrointestinal: Positive for abdominal pain (lower abd pain), nausea and vomiting. Negative for diarrhea.  Genitourinary: Positive for flank pain (right), frequency and urgency. Negative for dysuria, hematuria, vaginal bleeding, vaginal discharge and vaginal pain.  Musculoskeletal: Negative for neck pain and neck stiffness.  Skin: Negative.   Neurological: Negative for dizziness, syncope, weakness, light-headedness, numbness and headaches.  All other systems reviewed and are negative.    Physical Exam Updated Vital Signs BP 117/88   Pulse 88   Temp 97.6 F (36.4 C) (Oral)   Resp 16   Ht 5\' 8"  (1.727 m)   Wt 104.3 kg   SpO2 100%   BMI 34.97 kg/m   Physical Exam  Constitutional: She is oriented to person, place, and time. She appears well-developed and well-nourished. No distress.  HENT:  Head: Normocephalic and atraumatic.  Mouth/Throat: Oropharynx is clear and moist.  Eyes: Conjunctivae are normal. Pupils are equal, round, and reactive to light. Right eye exhibits no discharge. Left eye exhibits no discharge. No scleral icterus.  Neck: Normal range of motion. Neck supple. No thyromegaly present.  Cardiovascular: Normal rate, regular rhythm, normal heart sounds and intact distal pulses.  Exam reveals no gallop and no friction rub.   No murmur heard. Pulmonary/Chest:  Effort normal and breath sounds normal. No respiratory distress.  Abdominal: Soft. Bowel sounds are normal. She exhibits no distension. There is tenderness in the suprapubic area. There is CVA tenderness (right). There is no rigidity, no rebound, no guarding, no tenderness at McBurney's point and negative Murphy's sign.  Musculoskeletal: Normal range of motion.  Lymphadenopathy:    She has no cervical adenopathy.  Neurological: She is alert and oriented to person, place, and time.  Skin: Skin is warm and dry. Capillary refill takes less than 2 seconds.  Nursing note and vitals reviewed.    ED Treatments / Results  Labs (all labs ordered are listed, but only abnormal results are displayed) Labs Reviewed  URINALYSIS, ROUTINE W REFLEX MICROSCOPIC (NOT AT [facility]) - Abnormal; Notable for the following:       Result Value   APPearance CLOUDY (*)    Hgb urine dipstick TRACE (*)    All other components within normal limits  COMPREHENSIVE METABOLIC PANEL - Abnormal; Notable for the following:    Glucose, Bld 110 (*)  Creatinine, Ser 1.05 (*)    All other components within normal limits  CBC - Abnormal; Notable for the following:    HCT 35.6 (*)    All other components within normal limits  URINE MICROSCOPIC-ADD ON - Abnormal; Notable for the following:    Squamous Epithelial / LPF 6-30 (*)    Bacteria, UA FEW (*)    All other components within normal limits  LIPASE, BLOOD  PREGNANCY, URINE    EKG  EKG Interpretation None       Radiology Ct Renal Stone Study  Result Date: 01/29/2016 CLINICAL DATA:  Bilateral flank pain, worsening since Tuesday, worse on right. Severe nausea. History of kidney stones. EXAM: CT ABDOMEN AND PELVIS WITHOUT CONTRAST TECHNIQUE: Multidetector CT imaging of the abdomen and pelvis was performed following the standard protocol without IV contrast. COMPARISON:  CT abdomen dated 04/03/2012. FINDINGS: Lower chest: No acute abnormality. Hepatobiliary: The  subtle hypodense focus within segment 4 of the left liver lobe is not significantly changed compared to previous exams and is again most suggestive of focal fatty infiltration. Liver otherwise unremarkable. No gallstone seen and no evidence of cholecystitis. No bile duct dilatation. Pancreas: Unremarkable. No pancreatic ductal dilatation or surrounding inflammatory changes. Spleen: Normal in size without focal abnormality. Adrenals/Urinary Tract: Persistent right-sided hydronephrosis and hydroureter, moderate to severe in degree, is not significantly changed compared to the previous study of 09/05/2015 or CT abdomen of 04/03/2012. Again noted is a 5 x 4 mm stone within the distal right ureter. Multiple renal stones again noted bilaterally, without change in the short-term interval. No left-sided hydronephrosis. Stomach/Bowel: Bowel is normal in caliber. Scattered diverticulosis noted throughout the colon, most prominent within the descending and sigmoid colon, without evidence of acute diverticulitis. Appendix is normal. Stomach appears normal. Vascular/Lymphatic: Abdominal aorta is normal in caliber. No enlarged lymph nodes seen. Reproductive: Unremarkable. Other: No free fluid or abscess collection. No free intraperitoneal air. Musculoskeletal: Mild degenerative change in the lumbar spine. No acute or suspicious osseous finding. Small periumbilical abdominal wall hernia which contains fat only. Superficial soft tissues are otherwise unremarkable. IMPRESSION: 1. Persistent right-sided hydronephrosis and hydroureter, moderate to severe in degree, not significantly changed compared to previous studies of 09/05/2015 and 04/03/2012. Associated 5 mm stone within the distal right ureter, unchanged in position. 2. Bilateral nephrolithiasis, stable. 3. Probable focal fatty infiltration within the left hepatic lobe, grossly stable, difficulty to definitively characterize without IV contrast. 4. Colonic diverticulosis  without evidence of acute diverticulitis. 5. No new findings. Electronically Signed   By: Bary Richard M.D.   On: 01/29/2016 16:43    Procedures Procedures (including critical care time)  Medications Ordered in ED Medications  ondansetron (ZOFRAN-ODT) disintegrating tablet 4 mg (4 mg Oral Given 01/29/16 1107)  ondansetron (ZOFRAN) injection 4 mg (4 mg Intravenous Given 01/29/16 1637)  HYDROmorphone (DILAUDID) injection 1 mg (1 mg Intravenous Given 01/29/16 1637)  ketorolac (TORADOL) 15 MG/ML injection 15 mg (15 mg Intravenous Given 01/29/16 1751)  promethazine (PHENERGAN) injection 12.5 mg (12.5 mg Intravenous Given 01/29/16 1814)     Initial Impression / Assessment and Plan / ED Course  I have reviewed the triage vital signs and the nursing notes.  Pertinent labs & imaging results that were available during my care of the patient were reviewed by me and considered in my medical decision making (see chart for details).  Clinical Course   Patient presents to the ED with right flank pain and suprapubic abdominal pain. Patient with history of  kidney stones. CT was performed that showed a right renal ureter 5 mm stone that is unchanged from prior CT. She also has significant hydronephrosis of the right kidney and ureter that has been unchanged since her CAT scan in July 2017 and in 2014. Talked with Dr. Reggy EyeMacdiamond urologist who recommends controlling pain, nausea, starting patient on Flomax and having her follow-up in the outpatient setting on Monday. Patient intractable vomiting has since resolved after the Phenergan and is tolerating PO fluids. Patient's lab unremarkable. Serum creatinine is within normal limits. Patient's pain has been controlled in the ED with Toradol, Dilaudid. Patient will be discharged home with pain medicine and nausea medicine. She is afebrile without any luekocytosis. Ua shows few bacteria without any nitrites, leukocytes, WBCs. She likely has contaminated sample given  increased squamous epithelium. Will not treat at this time. Given strict return precautions. Pt is hemodynamically stable, in NAD, & able to ambulate in the ED. Pain has been managed & has no complaints prior to dc. Pt is comfortable with above plan and is stable for discharge at this time. All questions were answered prior to disposition. Strict return precautions for f/u to the ED were discussed.    Final Clinical Impressions(s) / ED Diagnoses   Final diagnoses:  Nephrolithiasis    New Prescriptions New Prescriptions   HYDROCODONE-ACETAMINOPHEN (NORCO/VICODIN) 5-325 MG TABLET    Take 1-2 tablets by mouth every 4 (four) hours as needed.   PROMETHAZINE (PHENERGAN) 25 MG TABLET    Take 1 tablet (25 mg total) by mouth every 6 (six) hours as needed for nausea or vomiting.   TAMSULOSIN (FLOMAX) 0.4 MG CAPS CAPSULE    Take 1 capsule (0.4 mg total) by mouth daily.     Rise MuKenneth T Leaphart, PA-C 01/29/16 1935    Nira ConnPedro Eduardo Cardama, MD 01/30/16 [phone]

## 2016-01-29 NOTE — ED Triage Notes (Signed)
Pt complaint of bilateral flank pain worsening since Tuesday. Pt reports pain relieved by Vicodin taken at 0630 but severe nausea at present time. Hx of kidney stones.

## 2016-01-29 NOTE — Discharge Instructions (Signed)
A kidney stone has been seen on CT. Please take the Phenergan and pain medicine as needed and as prescribed. Please take his Flomax once a day. May also take ibuprofen to help with the inflammation. Please return to the ED if you develops fevers, worsening pain, vomiting that is unable to be controlled with medicine. You need to follow-up with [facility] on Monday for an appointment for possible surgery.

## 2016-02-01 ENCOUNTER — Encounter (HOSPITAL_COMMUNITY): Payer: Self-pay | Admitting: *Deleted

## 2016-02-01 ENCOUNTER — Ambulatory Visit (HOSPITAL_COMMUNITY): Payer: 59 | Admitting: Anesthesiology

## 2016-02-01 ENCOUNTER — Ambulatory Visit (HOSPITAL_COMMUNITY)
Admission: RE | Admit: 2016-02-01 | Discharge: 2016-02-01 | Disposition: A | Discharge status: Home / Self Care | Payer: 59 | Source: Ambulatory Visit | Attending: Urology | Admitting: Urology

## 2016-02-01 ENCOUNTER — Other Ambulatory Visit: Payer: Self-pay | Admitting: Urology

## 2016-02-01 ENCOUNTER — Encounter (HOSPITAL_COMMUNITY)
Admission: RE | Disposition: A | Discharge status: Home / Self Care | Payer: Self-pay | Source: Ambulatory Visit | Attending: Urology

## 2016-02-01 DIAGNOSIS — N202 Calculus of kidney with calculus of ureter: Secondary | ICD-10-CM | POA: Diagnosis not present

## 2016-02-01 DIAGNOSIS — J45909 Unspecified asthma, uncomplicated: Secondary | ICD-10-CM | POA: Insufficient documentation

## 2016-02-01 DIAGNOSIS — Z880 Allergy status to penicillin: Secondary | ICD-10-CM | POA: Insufficient documentation

## 2016-02-01 DIAGNOSIS — N200 Calculus of kidney: Secondary | ICD-10-CM | POA: Diagnosis not present

## 2016-02-01 DIAGNOSIS — N132 Hydronephrosis with renal and ureteral calculous obstruction: Secondary | ICD-10-CM | POA: Insufficient documentation

## 2016-02-01 DIAGNOSIS — N201 Calculus of ureter: Secondary | ICD-10-CM | POA: Diagnosis not present

## 2016-02-01 HISTORY — PX: CYSTOSCOPY/URETEROSCOPY/HOLMIUM LASER/STENT PLACEMENT: SHX6546

## 2016-02-01 SURGERY — CYSTOSCOPY/URETEROSCOPY/HOLMIUM LASER/STENT PLACEMENT
Anesthesia: General | Laterality: Right

## 2016-02-01 MED ORDER — ROCURONIUM BROMIDE 50 MG/5ML IV SOSY
PREFILLED_SYRINGE | INTRAVENOUS | Status: AC
  Filled 2016-02-01: qty 5

## 2016-02-01 MED ORDER — ONDANSETRON HCL 4 MG/2ML IJ SOLN
INTRAMUSCULAR | Status: AC
  Filled 2016-02-01: qty 2

## 2016-02-01 MED ORDER — MIDAZOLAM HCL 5 MG/5ML IJ SOLN
INTRAMUSCULAR | Status: DC | PRN
  Administered 2016-02-01: 2 mg via INTRAVENOUS

## 2016-02-01 MED ORDER — HYDROCODONE-ACETAMINOPHEN 5-325 MG PO TABS: 1.0000 | ORAL_TABLET | Freq: Four times a day (QID) | ORAL | 0 refills | Status: DC | PRN

## 2016-02-01 MED ORDER — PHENAZOPYRIDINE HCL 200 MG PO TABS: 200.0000 mg | ORAL_TABLET | Freq: Three times a day (TID) | ORAL | 1 refills | Status: DC | PRN

## 2016-02-01 MED ORDER — SODIUM CHLORIDE 0.9 % IR SOLN
Status: DC | PRN
  Administered 2016-02-01: 4000 mL via INTRAVESICAL

## 2016-02-01 MED ORDER — FENTANYL CITRATE (PF) 100 MCG/2ML IJ SOLN
INTRAMUSCULAR | Status: AC
  Filled 2016-02-01: qty 2

## 2016-02-01 MED ORDER — LIDOCAINE 2% (20 MG/ML) 5 ML SYRINGE
INTRAMUSCULAR | Status: AC
  Filled 2016-02-01: qty 5

## 2016-02-01 MED ORDER — PROPOFOL 10 MG/ML IV BOLUS
INTRAVENOUS | Status: DC | PRN
  Administered 2016-02-01: 180 mg via INTRAVENOUS

## 2016-02-01 MED ORDER — SUGAMMADEX SODIUM 500 MG/5ML IV SOLN
INTRAVENOUS | Status: AC
  Filled 2016-02-01: qty 5

## 2016-02-01 MED ORDER — CIPROFLOXACIN IN D5W 400 MG/200ML IV SOLN
INTRAVENOUS | Status: AC
  Filled 2016-02-01: qty 200

## 2016-02-01 MED ORDER — SUGAMMADEX SODIUM 500 MG/5ML IV SOLN
INTRAVENOUS | Status: DC | PRN
  Administered 2016-02-01: 300 mg via INTRAVENOUS

## 2016-02-01 MED ORDER — PROMETHAZINE HCL 25 MG/ML IJ SOLN: 6.2500 mg | INTRAMUSCULAR | Status: DC | PRN

## 2016-02-01 MED ORDER — ONDANSETRON HCL 4 MG/2ML IJ SOLN
INTRAMUSCULAR | Status: DC | PRN
  Administered 2016-02-01: 4 mg via INTRAVENOUS

## 2016-02-01 MED ORDER — LACTATED RINGERS IV SOLN: INTRAVENOUS | Status: DC

## 2016-02-01 MED ORDER — SUCCINYLCHOLINE CHLORIDE 200 MG/10ML IV SOSY
PREFILLED_SYRINGE | INTRAVENOUS | Status: AC
  Filled 2016-02-01: qty 10

## 2016-02-01 MED ORDER — FENTANYL CITRATE (PF) 100 MCG/2ML IJ SOLN
25.0000 ug | INTRAMUSCULAR | Status: DC | PRN
  Administered 2016-02-01 (×2): 50 ug via INTRAVENOUS

## 2016-02-01 MED ORDER — MIDAZOLAM HCL 2 MG/2ML IJ SOLN
INTRAMUSCULAR | Status: AC
  Filled 2016-02-01: qty 2

## 2016-02-01 MED ORDER — FENTANYL CITRATE (PF) 100 MCG/2ML IJ SOLN
INTRAMUSCULAR | Status: DC | PRN
  Administered 2016-02-01 (×2): 50 ug via INTRAVENOUS

## 2016-02-01 MED ORDER — DEXAMETHASONE SODIUM PHOSPHATE 10 MG/ML IJ SOLN
INTRAMUSCULAR | Status: DC | PRN
  Administered 2016-02-01: 10 mg via INTRAVENOUS

## 2016-02-01 MED ORDER — KETOROLAC TROMETHAMINE 30 MG/ML IJ SOLN: 30.0000 mg | Freq: Once | INTRAMUSCULAR | Status: DC | PRN

## 2016-02-01 MED ORDER — ROCURONIUM BROMIDE 50 MG/5ML IV SOSY
PREFILLED_SYRINGE | INTRAVENOUS | Status: DC | PRN
  Administered 2016-02-01: 30 mg via INTRAVENOUS

## 2016-02-01 MED ORDER — SUCCINYLCHOLINE CHLORIDE 200 MG/10ML IV SOSY
PREFILLED_SYRINGE | INTRAVENOUS | Status: DC | PRN
  Administered 2016-02-01: 100 mg via INTRAVENOUS

## 2016-02-01 MED ORDER — CIPROFLOXACIN IN D5W 400 MG/200ML IV SOLN
400.0000 mg | INTRAVENOUS | Status: AC
  Administered 2016-02-01: 400 mg via INTRAVENOUS

## 2016-02-01 MED ORDER — IOHEXOL 300 MG/ML  SOLN
INTRAMUSCULAR | Status: DC | PRN
Start: 2016-02-01 — End: 2016-02-01
  Administered 2016-02-01: 5 mL via URETHRAL

## 2016-02-01 MED ORDER — DEXAMETHASONE SODIUM PHOSPHATE 10 MG/ML IJ SOLN
INTRAMUSCULAR | Status: AC
  Filled 2016-02-01: qty 1

## 2016-02-01 MED ORDER — LACTATED RINGERS IV SOLN
INTRAVENOUS | Status: DC | PRN
  Administered 2016-02-01: 19:00:00 via INTRAVENOUS

## 2016-02-01 MED ORDER — LIDOCAINE HCL (CARDIAC) 10 MG/ML IV SOLN
INTRAVENOUS | Status: DC | PRN
  Administered 2016-02-01: 100 mg via INTRAVENOUS

## 2016-02-01 MED ORDER — PROPOFOL 10 MG/ML IV BOLUS
INTRAVENOUS | Status: AC
Start: 2016-02-01 — End: 2016-02-01
  Filled 2016-02-01: qty 20

## 2016-02-01 SURGICAL SUPPLY — 23 items
BAG URO CATCHER STRL LF (MISCELLANEOUS) ×2 IMPLANT
BASKET LASER NITINOL 1.9FR (BASKET) IMPLANT
BASKET STONE NCOMPASS (UROLOGICAL SUPPLIES) ×2 IMPLANT
CATH URET 5FR 28IN OPEN ENDED (CATHETERS) ×2 IMPLANT
CATH URET DUAL LUMEN 6-10FR 50 (CATHETERS) IMPLANT
CLOTH BEACON ORANGE TIMEOUT ST (SAFETY) ×2 IMPLANT
EXTRACTOR STONE NITINOL NGAGE (UROLOGICAL SUPPLIES) ×2 IMPLANT
FIBER LASER FLEXIVA 1000 (UROLOGICAL SUPPLIES) IMPLANT
FIBER LASER FLEXIVA 365 (UROLOGICAL SUPPLIES) IMPLANT
FIBER LASER FLEXIVA 550 (UROLOGICAL SUPPLIES) IMPLANT
FIBER LASER TRAC TIP (UROLOGICAL SUPPLIES) ×2 IMPLANT
GLOVE SURG SS PI 8.0 STRL IVOR (GLOVE) ×6 IMPLANT
GOWN STRL REUS W/TWL XL LVL3 (GOWN DISPOSABLE) ×4 IMPLANT
GUIDEWIRE STR DUAL SENSOR (WIRE) ×2 IMPLANT
IV NS 1000ML (IV SOLUTION)
IV NS 1000ML BAXH (IV SOLUTION) IMPLANT
IV NS IRRIG 3000ML ARTHROMATIC (IV SOLUTION) IMPLANT
MANIFOLD NEPTUNE II (INSTRUMENTS) ×2 IMPLANT
PACK CYSTO (CUSTOM PROCEDURE TRAY) ×2 IMPLANT
SHEATH ACCESS URETERAL 38CM (SHEATH) ×2 IMPLANT
SHEATH URET ACCESS 10/12FR (MISCELLANEOUS) IMPLANT
STENT CONTOUR 6FRX26X.038 (STENTS) ×2 IMPLANT
TUBING CONNECTING 10 (TUBING) ×2 IMPLANT

## 2016-02-01 NOTE — H&P (Signed)
CC: I have ureteral stone.  HPI: Monica Williams is a 40 year-old female established patient who is here for ureteral stone.  The problem is on the right side. She first stated noticing pain on 12/28/2015. This is not her first kidney stone. She is currently having flank pain and nausea. She denies having back pain, groin pain, vomiting, fever, and chills. Pain is occuring on the right side. She has not caught a stone in her urine strainer since her symptoms began.   She has had eswl for treatment of her stones in the past.   Monica Williams had right ESWL for a distal stone in 7/17 and failed to f/u but passed several fragments. She was seen in the ER on 01/29/16 and a CT showed a 6mm right distal stone with moderate to severe hydroureteronephrosis. The stone appears to have been in the RLP on the prior CT. She is still having pain and nausea today. She has no voiding complaints.      ALLERGIES: Amoxicillin CAPS Penicillins Sulfa - Skin Rash    MEDICATIONS: Hydrocodone-Acetaminophen 10 mg-325 mg tablet 1 tablet PO Daily  Montelukast Sodium 10 MG Oral Tablet Oral  Ventolin HFA 108 (90 Base) MCG/ACT Inhalation Aerosol Solution Inhalation  Xyzal 1 PO Daily     GU PSH: Cysto Uretero Lithotripsy - 2014 Cystoscopy Insert Stent - 2014 Renal ESWL - 09/10/2015, 2013      PSH Notes: Cystoscopy With Ureteroscopy With Lithotripsy, Cystoscopy With Insertion Of Ureteral Stent Right, Tubal Ligation, Lithotripsy   NON-GU PSH: Tubal Ligation - 2013    GU PMH: Calculus Ureter, Calculus of distal right ureter - 2014 Cystocele, midline, Cystocele, midline - 2014 Kidney Stone, Nephrolithiasis - 2014 Mixed incontinence, Urge and stress incontinence - 2014 Nocturia, Nocturia - 2014 Personal Hx urinary calculi, Nephrolithiasis - 2014 Stress Incontinence, M/F, Female stress incontinence - 2014 Urinary Frequency, Increased urinary frequency - 2014      PMH Notes:  1898-03-07 00:00:00 - Note: Normal Routine  History And Physical Adult  2011-08-26 12:47:02 - Note: Chronic Major Depression   NON-GU PMH: Asthma, Asthma - 2014 Diverticulosis, Diverticulosis - 2014 Personal history of other diseases of the digestive system, History of hemorrhoids - 2014 Vitamin D deficiency, unspecified, Vitamin D Deficiency - 2014    FAMILY HISTORY: None   SOCIAL HISTORY: Marital Status: Divorced Current Smoking Status: Patient has never smoked.  Social Drinker.  Drinks 1 caffeinated drink per day. Has not had a blood transfusion. 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     Notes: Marital History - Single, Occupation:, Never A Smoker, Caffeine Use, Alcohol Use   REVIEW OF SYSTEMS:    GU Review Female:   Patient denies frequent urination, hard to postpone urination, burning /pain with urination, get up at night to urinate, leakage of urine, stream starts and stops, trouble starting your stream, have to strain to urinate, and currently pregnant.  Gastrointestinal (Upper):   Patient reports nausea and vomiting. Patient denies indigestion/ heartburn.  Gastrointestinal (Lower):   Patient denies diarrhea and constipation.  Constitutional:   Patient denies fever, night sweats, weight loss, and fatigue.  Skin:   Patient denies skin rash/ lesion and itching.  Eyes:   Patient denies blurred vision and double vision.  Ears/ Nose/ Throat:   Patient denies sore throat and sinus problems.  Hematologic/Lymphatic:   Patient denies swollen glands and easy bruising.  Cardiovascular:   Patient denies leg swelling and chest pains.  Respiratory:   Patient denies cough and shortness of breath.  Endocrine:   Patient denies excessive thirst.  Musculoskeletal:   Patient denies back pain and joint pain.  Neurological:   Patient denies dizziness and headaches.  Psychologic:   Patient denies depression and anxiety.   VITAL SIGNS:      02/01/2016 10:05 AM  Weight 230 lb / 104.33 kg  Height 68 in / 172.72 cm  BP 128/85 mmHg  Pulse 73  /min  Temperature 98.3 F / 37 C  BMI 35.0 kg/m   MULTI-SYSTEM PHYSICAL EXAMINATION:    Constitutional: Well-nourished. No physical deformities. Normally developed. Good grooming.  Respiratory: No labored breathing, no use of accessory muscles. CTA  Cardiovascular: RRR without murmur.  Gastrointestinal: Abdominal tenderness in RCVA, obese.      PAST DATA REVIEWED:  Source Of History:  Patient  Urine Test Review:   Urinalysis  X-Ray Review: C.T. Stone Protocol: Reviewed Films. Reviewed Report.     PROCEDURES:          Urinalysis Dipstick Dipstick Cont'd  Color: Yellow Bilirubin: Neg  Appearance: Clear Ketones: Neg  Specific Gravity: 1.020 Blood: Neg  pH: 6.5 Protein: Neg  Glucose: Neg Urobilinogen: 0.2    Nitrites: Neg    Leukocyte Esterase: Neg    ASSESSMENT:      ICD-10 Details  1 GU:   Calculus Ureter - N20.1    PLAN:           Schedule Return Visit: ASAP - Schedule Surgery          Document Letter(s):  Created for Patient: Clinical Summary         Notes:   She has a 6mm right distal stone and 5mm right renal stone.   I discussed the options and would like to proceed with ureteroscopy. I will try to get both stones.   I have reviewed the risks of bleeding, infection, ureteral injury, need for stent and secondary procedures, thrombotic events and anesthetic complications.

## 2016-02-01 NOTE — Anesthesia Procedure Notes (Signed)
Procedure Name: Intubation Date/Time: 02/01/2016 8:00 PM Performed by: Wynonia SoursWALKER, Shondell Fabel L Pre-anesthesia Checklist: Patient identified, Emergency Drugs available, Suction available, Patient being monitored and Timeout performed Patient Re-evaluated:Patient Re-evaluated prior to inductionOxygen Delivery Method: Circle system utilized Preoxygenation: Pre-oxygenation with 100% oxygen Intubation Type: IV induction, Rapid sequence and Cricoid Pressure applied Ventilation: Mask ventilation without difficulty Laryngoscope Size: Mac and 4 Grade View: Grade I Tube type: Oral Tube size: 7.5 mm Number of attempts: 1 Airway Equipment and Method: Stylet Placement Confirmation: ETT inserted through vocal cords under direct vision,  positive ETCO2,  CO2 detector and breath sounds checked- equal and bilateral Secured at: 21 cm Tube secured with: Tape Dental Injury: Teeth and Oropharynx as per pre-operative assessment

## 2016-02-01 NOTE — Anesthesia Postprocedure Evaluation (Signed)
Anesthesia Post Note  Patient: Alaya A Breunig  Procedure(s) Performed: Procedure(s) (LRB): CYSTOSCOPY/RIGHT URETEROSCOPY/ HOLMIUM LASER/ RIGHT STENT PLACEMENT (Right)  Patient location during evaluation: PACU Anesthesia Type: General Level of consciousness: awake and alert Pain management: pain level controlled Vital Signs Assessment: post-procedure vital signs reviewed and stable Respiratory status: spontaneous breathing, nonlabored ventilation, respiratory function stable and patient connected to nasal cannula oxygen Cardiovascular status: blood pressure returned to baseline and stable Postop Assessment: no signs of nausea or vomiting Anesthetic complications: no    Last Vitals:  Vitals:   02/01/16 2109 02/01/16 2130  BP: 129/82 131/78  Pulse: 80   Resp: 16   Temp: 36.5 C     Last Pain:  Vitals:   02/01/16 2145  TempSrc:   PainSc: 4                  Holdan Stucke S

## 2016-02-01 NOTE — Brief Op Note (Signed)
02/01/2016  8:56 PM  PATIENT:  Adelaine A Juan QuamParry  40 y.o. female  PRE-OPERATIVE DIAGNOSIS:  right distal renal stone and right renal stone  POST-OPERATIVE DIAGNOSIS:  same  PROCEDURE:  Procedure(s): CYSTOSCOPY/RIGHT URETEROSCOPY/ HOLMIUM LASER/ RIGHT STENT PLACEMENT (Right) right distal and right renal stones  SURGEON:  Surgeon(s) and Role:    * Bjorn PippinJohn Tayjah Lobdell, MD - Primary  PHYSICIAN ASSISTANT:   ASSISTANTS: none   ANESTHESIA:   general  EBL:  No intake/output data recorded.  BLOOD ADMINISTERED:none  DRAINS: 6 x 26 right JJ stent   LOCAL MEDICATIONS USED:  NONE  SPECIMEN:  Source of Specimen:  ureteral and renal stones.   DISPOSITION OF SPECIMEN:  to family to bring to office  COUNTS:  YES  TOURNIQUET:  * No tourniquets in log *  DICTATION: .Other Dictation: Dictation Number [id number]  PLAN OF CARE: Discharge to home after PACU  PATIENT DISPOSITION:  PACU - hemodynamically stable.   Delay start of Pharmacological VTE agent (>24hrs) due to surgical blood loss or risk of bleeding: not applicable

## 2016-02-01 NOTE — Discharge Instructions (Signed)
CYSTOSCOPY HOME CARE INSTRUCTIONS  Activity: Rest for the remainder of the day.  Do not drive or operate equipment today.  You may resume normal activities in one to two days as instructed by your physician.   Meals: Drink plenty of liquids and eat light foods such as gelatin or soup this evening.  You may return to a normal meal plan tomorrow.  Return to Work: You may return to work in one to two days or as instructed by your physician.  Special Instructions / Symptoms: Call your physician if any of these symptoms occur:   -persistent or heavy bleeding  -bleeding which continues after first few urination  -large blood clots that are difficult to pass  -urine stream diminishes or stops completely  -fever equal to or higher than 101 degrees Farenheit.  -cloudy urine with a strong, foul odor  -severe pain  Females should always wipe from front to back after elimination.  You may feel some burning pain when you urinate.  This should disappear with time.  Applying moist heat to the lower abdomen or a hot tub bath may help relieve the pain. \  I want the stent to stay in for 2 weeks for the ureter to heal.  Call the office for a f/u appt for 2 weeks for stent removal.   Bring the stones when you come.   Patient Signature:  ________________________________________________________  Nurse's Signature:  ________________________________________________________

## 2016-02-01 NOTE — Progress Notes (Signed)
PACU nursing note:  Pt ready for DC home, alert and oriented, mae x 4, denies any nausea, pain under control, vss. DC instructions reviewed with pt and family member... (1) importance of making and keeping post op md appt (2) importance of handwashing before and after voiding (3) safety while taking PO pain med as written by MD (4) post op diet and activity, such as start light with foods and advance as tolerated, and no driving for the next 24 hours.  (5) when to call MD on call, such as unable to obtain pain relief, unable to void, bright red blood in urine Opportunity for questions provided, with copy of instructions and two (2) Rx given to patient as well, family member with patient. Pt escorted to exit via w/c, family here to drive patient home

## 2016-02-01 NOTE — Anesthesia Preprocedure Evaluation (Signed)
Anesthesia Evaluation  Patient identified by MRN, date of birth, ID band Patient awake    Reviewed: Allergy & Precautions, NPO status , Patient's Chart, lab work & pertinent test results  Airway Mallampati: II  TM Distance: >3 FB Neck ROM: Full    Dental no notable dental hx.    Pulmonary asthma ,    Pulmonary exam normal breath sounds clear to auscultation       Cardiovascular negative cardio ROS Normal cardiovascular exam Rhythm:Regular Rate:Normal     Neuro/Psych negative neurological ROS  negative psych ROS   GI/Hepatic negative GI ROS, Neg liver ROS,   Endo/Other  negative endocrine ROS  Renal/GU negative Renal ROS  negative genitourinary   Musculoskeletal negative musculoskeletal ROS (+)   Abdominal   Peds negative pediatric ROS (+)  Hematology negative hematology ROS (+)   Anesthesia Other Findings   Reproductive/Obstetrics negative OB ROS                             Anesthesia Physical Anesthesia Plan  ASA: II  Anesthesia Plan: General   Post-op Pain Management:    Induction: Intravenous  Airway Management Planned: LMA  Additional Equipment:   Intra-op Plan:   Post-operative Plan: Extubation in OR  Informed Consent: I have reviewed the patients History and Physical, chart, labs and discussed the procedure including the risks, benefits and alternatives for the proposed anesthesia with the patient or authorized representative who has indicated his/her understanding and acceptance.   Dental advisory given  Plan Discussed with: CRNA and Surgeon  Anesthesia Plan Comments:         Anesthesia Quick Evaluation  

## 2016-02-01 NOTE — Transfer of Care (Signed)
Immediate Anesthesia Transfer of Care Note  Patient: Monica Williams  Procedure(s) Performed: Procedure(s): CYSTOSCOPY/RIGHT URETEROSCOPY/ HOLMIUM LASER/ RIGHT STENT PLACEMENT (Right)  Patient Location: PACU  Anesthesia Type:General  Level of Consciousness:  sedated, patient cooperative and responds to stimulation  Airway & Oxygen Therapy:Patient Spontanous Breathing and Patient connected to face mask oxgen  Post-op Assessment:  Report given to PACU RN and Post -op Vital signs reviewed and stable  Post vital signs:  Reviewed and stable  Last Vitals:  Vitals:   02/01/16 1622 02/01/16 2109  BP: (!) 123/96 129/82  Pulse: 80   Resp: 16 16  Temp: 37.3 C 36.5 C    Complications: No apparent anesthesia complications

## 2016-02-02 ENCOUNTER — Encounter (HOSPITAL_COMMUNITY): Payer: Self-pay | Admitting: Urology

## 2016-02-03 NOTE — Op Note (Signed)
NAMRenaldo Reel:  Eber, Hue                ACCOUNT NO.:  [id number]  MEDICAL RECORD NO.:  [id number]  LOCATION:                                 FACILITY:  PHYSICIAN:  Excell SeltzerJohn J. Annabell HowellsWrenn, M.D.    DATE OF BIRTH:  Jan 14, 1976  DATE OF PROCEDURE:  02/01/2016 DATE OF DISCHARGE:                              OPERATIVE REPORT   PROCEDURES: 1. Cystoscopy with right retrograde pyelogram and interpretation. 2. Right ureteroscopy with holmium lasertripsy and stone removal of a     right distal stone. 3. Right ureteroscopy with holmium laser and removal of a right renal     stone with insertion of right double-J stent.  PREOPERATIVE DIAGNOSIS:  Obstructing right distal ureteral stone and right renal stone.  POSTOPERATIVE DIAGNOSIS:  Obstructing right distal ureteral stone and right renal stone.  SURGEON:  Excell SeltzerJohn J. Annabell HowellsWrenn, M.D.  ANESTHESIA:  General.  SPECIMEN:  Stone fragments.  DRAINS:  A 6-French x 26-cm double-J stent without tether.  BLOOD LOSS:  Minimal.  COMPLICATIONS:  None.  INDICATIONS:  Ms. Juan Quamarry is a 40 year old white female with a history of recurrent urolithiasis, who presented with right flank pain and a CT that showed approximately 6-mm right distal stone with moderate to severe obstruction.  There was a similar-sized stone in the upper pole of the right kidney.  The patient reports that felt best to remove both the ureteral and renal stones.  FINDINGS OF PROCEDURE:  She was given Cipro and was taken to the operating room where general anesthetic was induced.  She was placed in lithotomy position.  Her perineum and genitalia were prepped with Betadine solution.  She was draped in the usual sterile fashion.  Cystoscopy was performed using the 23-French scope and 30-degree lens. Examination revealed a normal urethra.  The bladder wall was smooth and pale without tumor, stones, or inflammation.  The ureteral orifices were unremarkable.  She did have a cystocele with a deep bladder  base.  The right ureteral orifice was cannulated with 5-French open-end catheter and contrast was instilled.  There was a normal ureter for approximately 5-6 cm up to the stone.  Contrast would not go by the stone.  I advanced the open-end catheter to just below the stone and advanced a Sensor wire.  It did go by the stone.  I then advanced the open-end catheter over the wire and removed the wire to ensure I was in the ureter.  A brisk hydronephrotic drip was noted.  At this point, the guidewire was reinserted to the kidney and the open- end catheter was removed.  The 4.5-French semi-rigid ureteroscope was then passed alongside the wire up to the level of the stone.  There was some edema around the stone, which was densely impacted, however, I was able to engage it with a 200-micron laser fiber set on 0.5 watts and 20 hertz.  The stone was gradually fragmented into manageable fragments.  The site of the stone impaction had very abnormal looking mucosa consistent with some inflammatory necrotic changes, likely just due from prolonged stone impaction.  Once the stone had been completely fragmented, an NCompass basket was used to remove all of the  fragments.  I then advanced a 38-cm digital access sheath inner core over the wire. It easily passed to the kidney.  The dilator was assembled with a 14- French sheath and inserted to the kidney, and the wire and inner core were removed.  The dual-lumen digital flexible was then advanced through the sheath to the kidney and the stone in the upper pole was identified. Inspection of the remaining calices revealed Randall's plaque, but no significant stones.  The stone in the upper pole was then engaged with an NGage basket and removed from the papillary tip where it was adherent.  The stone was then brought down to the top of the sheath, but it was too large to pass.  It was then engaged with the laser and broken into manageable fragments,  which were then removed with the NGage basket.  Once all significant fragments were removed, the ureteroscope was removed and the guidewire was passed through the access sheath to the kidney.  The access sheath was removed.  The cystoscope was reinserted over the wire and a 6-French 26-cm double-J stent without tether was inserted into the kidney under fluoroscopic guidance.  The wire was removed leaving a good coil in the kidney and a good coil in the bladder.  The bladder was then drained and some residual stone fragments that had been left in the bladder were removed.  The final fluoroscopic view revealed good coil in the kidney and good coil in the bladder.  The patient was taken down from lithotomy position.  Her anesthetic was reversed.  She was moved to the recovery room in stable condition. There were no complications.     Excell SeltzerJohn J. Annabell HowellsWrenn, M.D.   ______________________________ Excell SeltzerJohn J. Annabell HowellsWrenn, M.D.    JJW/MEDQ  D:  02/01/2016  T:  02/02/2016  Job:  [id number]

## 2016-02-18 DIAGNOSIS — N201 Calculus of ureter: Secondary | ICD-10-CM | POA: Diagnosis not present

## 2016-04-29 MED FILL — LEVOCETIRIZINE 5 MG TABLET: 5 | 90 days supply | Qty: 90 | Fill #3

## 2016-04-29 MED FILL — MONTELUKAST SOD 10 MG TAB: 10 | 90 days supply | Qty: 90 | Fill #2

## 2016-07-22 MED FILL — METHYLPREDNISOLONE 4 MG TAB: 4 | 6 days supply | Qty: 21 | Fill #0

## 2016-09-05 DIAGNOSIS — N200 Calculus of kidney: Secondary | ICD-10-CM | POA: Diagnosis not present

## 2016-10-10 ENCOUNTER — Other Ambulatory Visit: Payer: Self-pay

## 2016-10-10 MED ORDER — AZITHROMYCIN 250 MG PO TABS: 250.0000 mg | ORAL_TABLET | Freq: Every day | ORAL | 0 refills | Status: DC

## 2016-10-10 MED ORDER — AZITHROMYCIN 1 G PO PACK: 1.0000 g | PACK | Freq: Once | ORAL | 0 refills | Status: DC

## 2016-10-10 MED FILL — AZITHROMYCIN 250 MG TABLET: 250 | 5 days supply | Qty: 6 | Fill #0

## 2017-03-05 DIAGNOSIS — H5213 Myopia, bilateral: Secondary | ICD-10-CM | POA: Diagnosis not present

## 2017-04-07 ENCOUNTER — Encounter: Payer: Self-pay | Admitting: Nurse Practitioner

## 2017-04-07 ENCOUNTER — Ambulatory Visit (INDEPENDENT_AMBULATORY_CARE_PROVIDER_SITE_OTHER): Payer: No Typology Code available for payment source | Admitting: Nurse Practitioner

## 2017-04-07 VITALS — BP 122/82 | HR 89 | Temp 98.6°F | Ht 68.0 in | Wt 225.0 lb

## 2017-04-07 DIAGNOSIS — J301 Allergic rhinitis due to pollen: Secondary | ICD-10-CM | POA: Diagnosis not present

## 2017-04-07 DIAGNOSIS — K2100 Gastro-esophageal reflux disease with esophagitis, without bleeding: Secondary | ICD-10-CM

## 2017-04-07 DIAGNOSIS — K219 Gastro-esophageal reflux disease without esophagitis: Secondary | ICD-10-CM | POA: Insufficient documentation

## 2017-04-07 DIAGNOSIS — F339 Major depressive disorder, recurrent, unspecified: Secondary | ICD-10-CM | POA: Diagnosis not present

## 2017-04-07 MED ORDER — RANITIDINE HCL 300 MG PO TABS: 300.0000 mg | ORAL_TABLET | Freq: Every day | ORAL | 0 refills | Status: DC

## 2017-04-07 MED ORDER — DULOXETINE HCL 30 MG PO CPEP: 30.0000 mg | ORAL_CAPSULE | Freq: Every day | ORAL | 0 refills | Status: DC

## 2017-04-07 MED ORDER — OMEPRAZOLE 20 MG PO CPDR: 20.0000 mg | DELAYED_RELEASE_CAPSULE | Freq: Every day | ORAL | 0 refills | Status: DC

## 2017-04-07 MED FILL — OMEPRAZOLE 20 MG CAP: 20 | 30 days supply | Qty: 30 | Fill #0

## 2017-04-07 MED FILL — raNITIdine HCL 300 MG TABS: 300 | 14 days supply | Qty: 14 | Fill #0

## 2017-04-07 MED FILL — DULoxetine HCL 30 MG CPEP: 30 | 30 days supply | Qty: 30 | Fill #0

## 2017-04-07 NOTE — Patient Instructions (Signed)
Gastroesophageal Reflux Disease, Adult Normally, food travels down the esophagus and stays in the stomach to be digested. If a person has gastroesophageal reflux disease (GERD), food and stomach acid move back up into the esophagus. When this happens, the esophagus becomes sore and swollen (inflamed). Over time, GERD can make small holes (ulcers) in the lining of the esophagus. Follow these instructions at home: Diet  Follow a diet as told by your doctor. You may need to avoid foods and drinks such as: ? Coffee and tea (with or without caffeine). ? Drinks that contain alcohol. ? Energy drinks and sports drinks. ? Carbonated drinks or sodas. ? Chocolate and cocoa. ? Peppermint and mint flavorings. ? Garlic and onions. ? Horseradish. ? Spicy and acidic foods, such as peppers, chili powder, curry powder, vinegar, hot sauces, and BBQ sauce. ? Citrus fruit juices and citrus fruits, such as oranges, lemons, and limes. ? Tomato-based foods, such as red sauce, chili, salsa, and pizza with red sauce. ? Fried and fatty foods, such as donuts, french fries, potato chips, and high-fat dressings. ? High-fat meats, such as hot dogs, rib eye steak, sausage, ham, and bacon. ? High-fat dairy items, such as whole milk, butter, and cream cheese.  Eat small meals often. Avoid eating large meals.  Avoid drinking large amounts of liquid with your meals.  Avoid eating meals during the 2-3 hours before bedtime.  Avoid lying down right after you eat.  Do not exercise right after you eat. General instructions  Pay attention to any changes in your symptoms.  Take over-the-counter and prescription medicines only as told by your doctor. Do not take aspirin, ibuprofen, or other NSAIDs unless your doctor says it is okay.  Do not use any tobacco products, including cigarettes, chewing tobacco, and e-cigarettes. If you need help quitting, ask your doctor.  Wear loose clothes. Do not wear anything tight around  your waist.  Raise (elevate) the head of your bed about 6 inches (15 cm).  Try to lower your stress. If you need help doing this, ask your doctor.  If you are overweight, lose an amount of weight that is healthy for you. Ask your doctor about a safe weight loss goal.  Keep all follow-up visits as told by your doctor. This is important. Contact a doctor if:  You have new symptoms.  You lose weight and you do not know why it is happening.  You have trouble swallowing, or it hurts to swallow.  You have wheezing or a cough that keeps happening.  Your symptoms do not get better with treatment.  You have a hoarse voice. Get help right away if:  You have pain in your arms, neck, jaw, teeth, or back.  You feel sweaty, dizzy, or light-headed.  You have chest pain or shortness of breath.  You throw up (vomit) and your throw up looks like blood or coffee grounds.  You pass out (faint).  Your poop (stool) is bloody or black.  You cannot swallow, drink, or eat. This information is not intended to replace advice given to you by your health care provider. Make sure you discuss any questions you have with your health care provider. Document Released: 08/10/2007 Document Revised: 07/30/2015 Document Reviewed: 06/18/2014 Elsevier Interactive Patient Education  2018 [employer]    Major Depressive Disorder, Adult Major depressive disorder (MDD) is a mental health condition. MDD often makes you feel sad, hopeless, or helpless. MDD can also cause symptoms in your body. MDD can affect your:  Work.  Programmer, multimedia.  Relationships.  Other normal activities.  MDD can range from mild to very bad. It may occur once (single episode MDD). It can also occur many times (recurrent MDD). The main symptoms of MDD often include:  Feeling sad, depressed, or irritable most of the time.  Loss of interest.  MDD symptoms also include:  Sleeping too much or too little.  Eating too much or too  little.  A change in your weight.  Feeling tired (fatigue) or having low energy.  Feeling worthless.  Feeling guilty.  Trouble making decisions.  Trouble thinking clearly.  Thoughts of suicide or harming others.  Feeling weak.  Feeling agitated.  Keeping yourself from being around other people (isolation).  Follow these instructions at home: Activity  Do these things as told by your doctor: ? Go back to your normal activities. ? Exercise regularly. ? Spend time outdoors. Alcohol  Talk with your doctor about how alcohol can affect your antidepressant medicines.  Do not drink alcohol. Or, limit how much alcohol you drink. ? This means no more than 1 drink a day for nonpregnant women and 2 drinks a day for men. One drink equals one of these:  12 oz of beer.  5 oz of wine.  1 oz of hard liquor. General instructions  Take over-the-counter and prescription medicines only as told by your doctor.  Eat a healthy diet.  Get plenty of sleep.  Find activities that you enjoy. Make time to do them.  Think about joining a support group. Your doctor may be able to suggest a group for you.  Keep all follow-up visits as told by your doctor. This is important. Where to find more information:  [employer] on Mental Illness: ? www.nami.org  [place] [employer] of Mental Health: ? http://www.maynard.net/  National Suicide Prevention Lifeline: ? (478)344-9690. This is free, 24-hour help. Contact a doctor if:  Your symptoms get worse.  You have new symptoms. Get help right away if:  You self-harm.  You see, hear, taste, smell, or feel things that are not present (hallucinate). If you ever feel like you may hurt yourself or others, or have thoughts about taking your own life, get help right away. You can go to your nearest emergency department or call:  Your local emergency services (911 in the [place]  A suicide crisis helpline, such as the National Suicide  Prevention Lifeline: ? (907) 603-9645. This is open 24 hours a day.  This information is not intended to replace advice given to you by your health care provider. Make sure you discuss any questions you have with your health care provider. Document Released: 02/02/2015 Document Revised: 11/08/2015 Document Reviewed: 11/08/2015 Elsevier Interactive Patient Education  2017 [employer].

## 2017-04-07 NOTE — Progress Notes (Signed)
Subjective:  Patient ID: Monica Williams, female    DOB: 11/11/1975  Age: 42 y.o. MRN: [id number]  CC: Establish Care (est care/allergy doctor consult and heart burns)  Gastroesophageal Reflux  She complains of abdominal pain, belching, choking, coughing, globus sensation, heartburn and water brash. She reports no chest pain, no dysphagia, no early satiety, no sore throat, no stridor, no tooth decay or no wheezing. This is a recurrent problem. The current episode started more than 1 month ago. The problem occurs constantly. The problem has been waxing and waning. The heartburn is located in the substernum and abdomen. The heartburn wakes her from sleep. The heartburn does not limit her activity. The heartburn doesn't change with position. The symptoms are aggravated by stress and lying down. Pertinent negatives include no anemia, fatigue, melena, muscle weakness, orthopnea or weight loss. Risk factors include obesity, hiatal hernia and lack of exercise. She has tried an antacid for the symptoms. The treatment provided no relief.  Depression       The patient presents with depression.  This is a chronic problem.  The current episode started more than 1 year ago.   The onset quality is gradual.   The problem occurs intermittently.  The problem has been waxing and waning since onset.  Associated symptoms include decreased concentration, insomnia, irritable, restlessness, appetite change, body aches and myalgias.  Associated symptoms include no fatigue, no helplessness, no hopelessness, no decreased interest, no headaches, no indigestion, not sad and no suicidal ideas.     The symptoms are aggravated by work stress and family issues.  Past treatments include SNRIs - Serotonin and norepinephrine reuptake inhibitors, SSRIs - Selective serotonin reuptake inhibitors and psychotherapy.  Compliance with treatment is good.  Past compliance problems include medication issues.  Previous treatment provided significant  relief.  Risk factors include suicide in immediate family, stress, family history of mental illness, family history and major life event.   Past medical history includes anxiety and depression.     Pertinent negatives include no suicide attempts.  Hx of colon polyps and diverticulosis( benign polyps per patient). Hx of hiatal hernia.  No med in last 28yrs. Last meds used :cymbalta, ambilify (weaned off 85yrs), stable mood when these meds Also took lexapro, wellbutrin, trazodone,.  Outpatient Medications Prior to Visit  Medication Sig Dispense Refill  . albuterol (PROVENTIL HFA;VENTOLIN HFA) 108 (90 Base) MCG/ACT inhaler Inhale into the lungs every 6 (six) hours as needed for wheezing or shortness of breath.    Marland Kitchen azithromycin (ZITHROMAX) 250 MG tablet Take 1-2 tablets (250-500 mg total) by mouth daily. Take 500mg  by mouth on day 1, then 250mg  daily x 4 days. Started on 11/20 (Patient not taking: Reported on 04/07/2017) 6 each 0  . EPINEPHrine 0.3 mg/0.3 mL IJ SOAJ injection Inject 0.3 mg into the muscle once.   1  . fluticasone furoate-vilanterol (BREO ELLIPTA) 100-25 MCG/INH AEPB Inhale 1 puff into the lungs daily.    Marland Kitchen HYDROcodone-acetaminophen (NORCO/VICODIN) 5-325 MG tablet Take 1-2 tablets by mouth every 6 (six) hours as needed for moderate pain or severe pain. (Patient not taking: Reported on 04/07/2017) 25 tablet 0  . levocetirizine (XYZAL) 5 MG tablet Take 5 mg by mouth every evening.    . montelukast (SINGULAIR) 10 MG tablet Take 10 mg by mouth at bedtime.    . phenazopyridine (PYRIDIUM) 200 MG tablet Take 1 tablet (200 mg total) by mouth 3 (three) times daily as needed for pain. (Patient not taking: Reported on  04/07/2017) 15 tablet 1  . promethazine (PHENERGAN) 25 MG tablet Take 1 tablet (25 mg total) by mouth every 6 (six) hours as needed for nausea or vomiting. (Patient not taking: Reported on 04/07/2017) 20 tablet 0  . SUMAtriptan (IMITREX) 25 MG tablet Take 25 mg by mouth every 2 (two) hours  as needed for migraine. May repeat in 2 hours if headache persists or recurs.    . tamsulosin (FLOMAX) 0.4 MG CAPS capsule Take 1 capsule (0.4 mg total) by mouth daily. (Patient not taking: Reported on 04/07/2017) 30 capsule 0   No facility-administered medications prior to visit.     ROS See HPI  Objective:  BP 122/82   Pulse 89   Temp 98.6 F (37 C) (Oral)   Ht 5\' 8"  (1.727 m)   Wt 225 lb (102.1 kg)   SpO2 98%   BMI 34.21 kg/m   BP Readings from Last 3 Encounters:  04/07/17 122/82  02/01/16 131/78  01/29/16 116/70    Wt Readings from Last 3 Encounters:  04/07/17 225 lb (102.1 kg)  02/01/16 222 lb 6.4 oz (100.9 kg)  01/29/16 230 lb (104.3 kg)    Physical Exam  Constitutional: She is oriented to person, place, and time. She is irritable.  Cardiovascular: Normal rate, regular rhythm and normal heart sounds.  Pulmonary/Chest: Effort normal and breath sounds normal.  Abdominal: She exhibits no distension. There is tenderness.  Neurological: She is alert and oriented to person, place, and time.  Skin: Skin is warm and dry.  Psychiatric: She has a normal mood and affect. Her behavior is normal. Thought content normal. Her speech is rapid and/or pressured. Cognition and memory are normal.  Vitals reviewed.   Lab Results  Component Value Date   WBC 9.5 01/29/2016   HGB 12.3 01/29/2016   HCT 35.6 (L) 01/29/2016   PLT 248 01/29/2016   GLUCOSE 110 (H) 01/29/2016   ALT 20 01/29/2016   AST 18 01/29/2016   NA 139 01/29/2016   K 4.1 01/29/2016   CL 108 01/29/2016   CREATININE 1.05 (H) 01/29/2016   BUN 20 01/29/2016   CO2 25 01/29/2016  .  Assessment & Plan:   Nivedita was seen today for establish care.  Diagnoses and all orders for this visit:  Gastroesophageal reflux disease with esophagitis -     omeprazole (PRILOSEC) 20 MG capsule; Take 1 capsule (20 mg total) by mouth daily before breakfast. -     ranitidine (ZANTAC) 300 MG tablet; Take 1 tablet (300 mg total) by  mouth at bedtime.  Seasonal allergic rhinitis due to pollen -     Ambulatory referral to Allergy  Depression, recurrent (HCC) -     DULoxetine (CYMBALTA) 30 MG capsule; Take 1 capsule (30 mg total) by mouth daily.   I am having Cedrica A. Juan QuamParry start on DULoxetine, omeprazole, and ranitidine. I am also having her maintain her montelukast, levocetirizine, EPINEPHrine, albuterol, fluticasone furoate-vilanterol, SUMAtriptan, promethazine, tamsulosin, HYDROcodone-acetaminophen, phenazopyridine, and azithromycin.  Meds ordered this encounter  Medications  . DULoxetine (CYMBALTA) 30 MG capsule    Sig: Take 1 capsule (30 mg total) by mouth daily.    Dispense:  30 capsule    Refill:  0    Order Specific Question:   Supervising Provider    Answer:   Dianne DunARON, TALIA M [[id number]]  . omeprazole (PRILOSEC) 20 MG capsule    Sig: Take 1 capsule (20 mg total) by mouth daily before breakfast.    Dispense:  30  capsule    Refill:  0    Order Specific Question:   Supervising Provider    Answer:   Dianne Dun [[id number]]  . ranitidine (ZANTAC) 300 MG tablet    Sig: Take 1 tablet (300 mg total) by mouth at bedtime.    Dispense:  14 tablet    Refill:  0    Order Specific Question:   Supervising Provider    Answer:   Dianne Dun [[id number]]    Follow-up: Return in about 2 weeks (around 04/21/2017) for CPE, f/up for depression and GERD (fasting).  Alysia Penna, NP

## 2017-04-10 ENCOUNTER — Encounter: Payer: Self-pay | Admitting: Nurse Practitioner

## 2017-05-05 ENCOUNTER — Other Ambulatory Visit (HOSPITAL_COMMUNITY)
Admission: RE | Admit: 2017-05-05 | Discharge: 2017-05-05 | Disposition: A | Discharge status: Home / Self Care | Payer: No Typology Code available for payment source | Source: Ambulatory Visit | Attending: Nurse Practitioner | Admitting: Nurse Practitioner

## 2017-05-05 ENCOUNTER — Encounter: Payer: Self-pay | Admitting: Nurse Practitioner

## 2017-05-05 ENCOUNTER — Ambulatory Visit (INDEPENDENT_AMBULATORY_CARE_PROVIDER_SITE_OTHER): Payer: No Typology Code available for payment source | Admitting: Nurse Practitioner

## 2017-05-05 VITALS — BP 110/72 | HR 90 | Temp 98.4°F | Ht 68.0 in | Wt 218.0 lb

## 2017-05-05 DIAGNOSIS — K2100 Gastro-esophageal reflux disease with esophagitis, without bleeding: Secondary | ICD-10-CM

## 2017-05-05 DIAGNOSIS — N939 Abnormal uterine and vaginal bleeding, unspecified: Secondary | ICD-10-CM | POA: Diagnosis not present

## 2017-05-05 DIAGNOSIS — F339 Major depressive disorder, recurrent, unspecified: Secondary | ICD-10-CM | POA: Insufficient documentation

## 2017-05-05 DIAGNOSIS — R739 Hyperglycemia, unspecified: Secondary | ICD-10-CM | POA: Diagnosis not present

## 2017-05-05 DIAGNOSIS — Z01419 Encounter for gynecological examination (general) (routine) without abnormal findings: Secondary | ICD-10-CM | POA: Diagnosis not present

## 2017-05-05 DIAGNOSIS — Z124 Encounter for screening for malignant neoplasm of cervix: Secondary | ICD-10-CM

## 2017-05-05 DIAGNOSIS — Z13220 Encounter for screening for lipoid disorders: Secondary | ICD-10-CM

## 2017-05-05 DIAGNOSIS — Z136 Encounter for screening for cardiovascular disorders: Secondary | ICD-10-CM | POA: Diagnosis not present

## 2017-05-05 LAB — COMPREHENSIVE METABOLIC PANEL
ALBUMIN: 4.1 g/dL (ref 3.5–5.2)
ALK PHOS: 62 U/L (ref 39–117)
ALT: 16 U/L (ref 0–35)
AST: 13 U/L (ref 0–37)
BUN: 19 mg/dL (ref 6–23)
CALCIUM: 9.4 mg/dL (ref 8.4–10.5)
CO2: 27 mEq/L (ref 19–32)
Chloride: 105 mEq/L (ref 96–112)
Creatinine, Ser: 0.75 mg/dL (ref 0.40–1.20)
GFR: 89.44 mL/min (ref >60.00)
Glucose, Bld: 81 mg/dL (ref 70–99)
POTASSIUM: 3.8 meq/L (ref 3.5–5.1)
Sodium: 140 mEq/L (ref 135–145)
Total Bilirubin: 1 mg/dL (ref 0.2–1.2)
Total Protein: 6.6 g/dL (ref 6.0–8.3)

## 2017-05-05 LAB — CBC
HEMATOCRIT: 35.6 % — AB (ref 36.0–46.0)
HEMOGLOBIN: 12.4 g/dL (ref 12.0–15.0)
MCHC: 34.8 g/dL (ref 30.0–36.0)
MCV: 86.9 fl (ref 78.0–100.0)
PLATELETS: 242 10*3/uL (ref 150.0–400.0)
RBC: 4.1 Mil/uL (ref 3.87–5.11)
RDW: 13.7 % (ref 11.5–15.5)
WBC: 6.3 10*3/uL (ref 4.0–10.5)

## 2017-05-05 LAB — LIPID PANEL
Cholesterol: 182 mg/dL (ref 0–200)
HDL: 35.3 mg/dL — AB (ref >39.00)
LDL Cholesterol: 129 mg/dL — ABNORMAL HIGH (ref 0–99)
NonHDL: 146.53
TRIGLYCERIDES: 88 mg/dL (ref 0.0–149.0)
Total CHOL/HDL Ratio: 5
VLDL: 17.6 mg/dL (ref 0.0–40.0)

## 2017-05-05 LAB — TSH: TSH: 1.41 u[IU]/mL (ref 0.35–4.50)

## 2017-05-05 LAB — HEMOGLOBIN A1C: Hgb A1c MFr Bld: 4.9 % (ref 4.6–6.5)

## 2017-05-05 MED ORDER — RANITIDINE HCL 150 MG PO TABS: 150.0000 mg | ORAL_TABLET | Freq: Two times a day (BID) | ORAL | 5 refills | Status: DC

## 2017-05-05 MED ORDER — DULOXETINE HCL 30 MG PO CPEP
30.0000 mg | ORAL_CAPSULE | Freq: Every day | ORAL | 1 refills | Status: DC
Start: 2017-05-05 — End: 2018-01-01

## 2017-05-05 MED FILL — DULoxetine HCL 30 MG CPEP: 30 | 90 days supply | Qty: 90 | Fill #0

## 2017-05-05 MED FILL — raNITIdine HCL 150 MG TABS: 150 | 30 days supply | Qty: 60 | Fill #0

## 2017-05-05 NOTE — Patient Instructions (Addendum)
You will be contacted to schedule appt for pelvic and transvaginal US.  Normal CMP, TSH, HgbA1c,CBC. ipid panel indicates elevated LDL and low HDL. These can improved with regular exercise and heart health diet. Pending PAP results.  Health Maintenance, Female Adopting a healthy lifestyle and getting preventive care can go a long way to promote health and wellness. Talk with your health care provider about what schedule of regular examinations is right for you. This is a good chance for you to check in with your provider about disease prevention and staying healthy. In between checkups, there are plenty of things you can do on your own. Experts have done a lot of research about which lifestyle changes and preventive measures are most likely to keep you healthy. Ask your health care provider for more information. Weight and diet Eat a healthy diet  Be sure to include plenty of vegetables, fruits, low-fat dairy products, and lean protein.  Do not eat a lot of foods high in solid fats, added sugars, or salt.  Get regular exercise. This is one of the most important things you can do for your health. ? Most adults should exercise for at least 150 minutes each week. The exercise should increase your heart rate and make you sweat (moderate-intensity exercise). ? Most adults should also do strengthening exercises at least twice a week. This is in addition to the moderate-intensity exercise.  Maintain a healthy weight  Body mass index (BMI) is a measurement that can be used to identify possible weight problems. It estimates body fat based on height and weight. Your health care provider can help determine your BMI and help you achieve or maintain a healthy weight.  For females 84 years of age and older: ? A BMI below 18.5 is considered underweight. ? A BMI of 18.5 to 24.9 is normal. ? A BMI of 25 to 29.9 is considered overweight. ? A BMI of 30 and above is considered obese.  Watch levels of  cholesterol and blood lipids  You should start having your blood tested for lipids and cholesterol at 44 years of age, then have this test every 5 years.  You may need to have your cholesterol levels checked more often if: ? Your lipid or cholesterol levels are high. ? You are older than 44 years of age. ? You are at high risk for heart disease.  Cancer screening Lung Cancer  Lung cancer screening is recommended for adults 37-31 years old who are at high risk for lung cancer because of a history of smoking.  A yearly low-dose CT scan of the lungs is recommended for people who: ? Currently smoke. ? Have quit within the past 15 years. ? Have at least a 30-pack-year history of smoking. A pack year is smoking an average of one pack of cigarettes a day for 1 year.  Yearly screening should continue until it has been 15 years since you quit.  Yearly screening should stop if you develop a health problem that would prevent you from having lung cancer treatment.  Breast Cancer  Practice breast self-awareness. This means understanding how your breasts normally appear and feel.  It also means doing regular breast self-exams. Let your health care provider know about any changes, no matter how small.  If you are in your 20s or 30s, you should have a clinical breast exam (CBE) by a health care provider every 1-3 years as part of a regular health exam.  If you are 40 or older, have  a CBE every year. Also consider having a breast X-ray (mammogram) every year.  If you have a family history of breast cancer, talk to your health care provider about genetic screening.  If you are at high risk for breast cancer, talk to your health care provider about having an MRI and a mammogram every year.  Breast cancer gene (BRCA) assessment is recommended for women who have family members with BRCA-related cancers. BRCA-related cancers include: ? Breast. ? Ovarian. ? Tubal. ? Peritoneal cancers.  Results  of the assessment will determine the need for genetic counseling and BRCA1 and BRCA2 testing.  Cervical Cancer Your health care provider may recommend that you be screened regularly for cancer of the pelvic organs (ovaries, uterus, and vagina). This screening involves a pelvic examination, including checking for microscopic changes to the surface of your cervix (Pap test). You may be encouraged to have this screening done every 3 years, beginning at age 62.  For women ages 52-65, health care providers may recommend pelvic exams and Pap testing every 3 years, or they may recommend the Pap and pelvic exam, combined with testing for human papilloma virus (HPV), every 5 years. Some types of HPV increase your risk of cervical cancer. Testing for HPV may also be done on women of any age with unclear Pap test results.  Other health care providers may not recommend any screening for nonpregnant women who are considered low risk for pelvic cancer and who do not have symptoms. Ask your health care provider if a screening pelvic exam is right for you.  If you have had past treatment for cervical cancer or a condition that could lead to cancer, you need Pap tests and screening for cancer for at least 20 years after your treatment. If Pap tests have been discontinued, your risk factors (such as having a new sexual partner) need to be reassessed to determine if screening should resume. Some women have medical problems that increase the chance of getting cervical cancer. In these cases, your health care provider may recommend more frequent screening and Pap tests.  Colorectal Cancer  This type of cancer can be detected and often prevented.  Routine colorectal cancer screening usually begins at 44 years of age and continues through 44 years of age.  Your health care provider may recommend screening at an earlier age if you have risk factors for colon cancer.  Your health care provider may also recommend using  home test kits to check for hidden blood in the stool.  A small camera at the end of a tube can be used to examine your colon directly (sigmoidoscopy or colonoscopy). This is done to check for the earliest forms of colorectal cancer.  Routine screening usually begins at age 52.  Direct examination of the colon should be repeated every 5-10 years through 44 years of age. However, you may need to be screened more often if early forms of precancerous polyps or small growths are found.  Skin Cancer  Check your skin from head to toe regularly.  Tell your health care provider about any new moles or changes in moles, especially if there is a change in a mole's shape or color.  Also tell your health care provider if you have a mole that is larger than the size of a pencil eraser.  Always use sunscreen. Apply sunscreen liberally and repeatedly throughout the day.  Protect yourself by wearing long sleeves, pants, a wide-brimmed hat, and sunglasses whenever you are outside.  Heart  disease, diabetes, and high blood pressure  High blood pressure causes heart disease and increases the risk of stroke. High blood pressure is more likely to develop in: ? People who have blood pressure in the high end of the normal range (130-139/85-89 mm Hg). ? People who are overweight or obese. ? People who are African American.  If you are 15-26 years of age, have your blood pressure checked every 3-5 years. If you are 6 years of age or older, have your blood pressure checked every year. You should have your blood pressure measured twice-once when you are at a hospital or clinic, and once when you are not at a hospital or clinic. Record the average of the two measurements. To check your blood pressure when you are not at a hospital or clinic, you can use: ? An automated blood pressure machine at a pharmacy. ? A home blood pressure monitor.  If you are between 70 years and 5 years old, ask your health care provider  if you should take aspirin to prevent strokes.  Have regular diabetes screenings. This involves taking a blood sample to check your fasting blood sugar level. ? If you are at a normal weight and have a low risk for diabetes, have this test once every three years after 44 years of age. ? If you are overweight and have a high risk for diabetes, consider being tested at a younger age or more often. Preventing infection Hepatitis B  If you have a higher risk for hepatitis B, you should be screened for this virus. You are considered at high risk for hepatitis B if: ? You were born in a country where hepatitis B is common. Ask your health care provider which countries are considered high risk. ? Your parents were born in a high-risk country, and you have not been immunized against hepatitis B (hepatitis B vaccine). ? You have HIV or AIDS. ? You use needles to inject street drugs. ? You live with someone who has hepatitis B. ? You have had sex with someone who has hepatitis B. ? You get hemodialysis treatment. ? You take certain medicines for conditions, including cancer, organ transplantation, and autoimmune conditions.  Hepatitis C  Blood testing is recommended for: ? Everyone born from [birth year] through 1967. ? Anyone with known risk factors for hepatitis C.  Sexually transmitted infections (STIs)  You should be screened for sexually transmitted infections (STIs) including gonorrhea and chlamydia if: ? You are sexually active and are younger than 44 years of age. ? You are older than 44 years of age and your health care provider tells you that you are at risk for this type of infection. ? Your sexual activity has changed since you were last screened and you are at an increased risk for chlamydia or gonorrhea. Ask your health care provider if you are at risk.  If you do not have HIV, but are at risk, it may be recommended that you take a prescription medicine daily to prevent HIV infection. This  is called pre-exposure prophylaxis (PrEP). You are considered at risk if: ? You are sexually active and do not regularly use condoms or know the HIV status of your partner(s). ? You take drugs by injection. ? You are sexually active with a partner who has HIV.  Talk with your health care provider about whether you are at high risk of being infected with HIV. If you choose to begin PrEP, you should first be tested for HIV. You  should then be tested every 3 months for as long as you are taking PrEP. Pregnancy  If you are premenopausal and you may become pregnant, ask your health care provider about preconception counseling.  If you may become pregnant, take 400 to 800 micrograms (mcg) of folic acid every day.  If you want to prevent pregnancy, talk to your health care provider about birth control (contraception). Osteoporosis and menopause  Osteoporosis is a disease in which the bones lose minerals and strength with aging. This can result in serious bone fractures. Your risk for osteoporosis can be identified using a bone density scan.  If you are 35 years of age or older, or if you are at risk for osteoporosis and fractures, ask your health care provider if you should be screened.  Ask your health care provider whether you should take a calcium or vitamin D supplement to lower your risk for osteoporosis.  Menopause may have certain physical symptoms and risks.  Hormone replacement therapy may reduce some of these symptoms and risks. Talk to your health care provider about whether hormone replacement therapy is right for you. Follow these instructions at home:  Schedule regular health, dental, and eye exams.  Stay current with your immunizations.  Do not use any tobacco products including cigarettes, chewing tobacco, or electronic cigarettes.  If you are pregnant, do not drink alcohol.  If you are breastfeeding, limit how much and how often you drink alcohol.  Limit alcohol intake  to no more than 1 drink per day for nonpregnant women. One drink equals 12 ounces of beer, 5 ounces of wine, or 1 ounces of hard liquor.  Do not use street drugs.  Do not share needles.  Ask your health care provider for help if you need support or information about quitting drugs.  Tell your health care provider if you often feel depressed.  Tell your health care provider if you have ever been abused or do not feel safe at home. This information is not intended to replace advice given to you by your health care provider. Make sure you discuss any questions you have with your health care provider. Document Released: 09/06/2010 Document Revised: 07/30/2015 Document Reviewed: 11/25/2014 [employer] Interactive Patient Education  [employer].

## 2017-05-05 NOTE — Progress Notes (Signed)
Subjective:    Patient ID: Monica Williams, female    DOB: 09/17/1975, 42 y.o.   MRN: [id number]  Patient presents today for complete physical   Vaginal Bleeding  The patient's primary symptoms include vaginal bleeding. The patient's pertinent negatives include no genital itching, genital lesions, genital odor, genital rash, missed menses, pelvic pain or vaginal discharge. This is a new problem. The current episode started more than 1 month ago. The problem occurs intermittently. The problem has been waxing and waning. The pain is mild. The problem affects both sides. She is not pregnant. Pertinent negatives include no abdominal pain, anorexia, back pain, chills, constipation, diarrhea, dysuria, fever, flank pain, frequency, headaches, joint pain, painful intercourse, rash, sore throat or urgency. The vaginal discharge was clear. The vaginal bleeding is spotting. She has been passing clots. She has not been passing tissue. Nothing aggravates the symptoms. She has tried nothing for the symptoms. She is sexually active. No, her partner does not have an STD. Contraceptive use: uterine ablation 2005. Her past medical history is significant for menorrhagia. There is no history of PID, an STD or vaginosis.  reports she had ablation 2005 due to menorrhagia, no menstrual cycle since ablation till in the last 3months.   GERD: Improved symptoms with ranitidine and diet changes. unable to tolerate omeprazole x 1week (nausea, lightnedness).  Depression: 50% Improved with cymbalta. Dose not wish to increase dose at this time.  Immunizations: (TDAP, Hep C screen, Pneumovax, Influenza, zoster)  Health Maintenance  Topic Date Due  . Pap Smear  07/04/2015  . HIV Screening  05/06/2018*  . Tetanus Vaccine  03/08/2025  . Flu Shot  Completed  *Topic was postponed. The date shown is not the original due date.   Diet:weight watchers.  Weight:  Wt Readings from Last 3 Encounters:  05/05/17 218 lb (98.9 kg)    04/07/17 225 lb (102.1 kg)  02/01/16 222 lb 6.4 oz (100.9 kg)    Exercise:none.  Fall Risk: Fall Risk  04/07/2017  Falls in the past year? No   Home Safety:home with husband and children.  Depression/Suicide: Depression screen PHQ 2/9 04/07/2017  Decreased Interest 3  Down, Depressed, Hopeless 3  PHQ - 2 Score 6  Altered sleeping 3  Tired, decreased energy 3  Change in appetite 3  Feeling bad or failure about yourself  3  Trouble concentrating 2  Moving slowly or fidgety/restless 1  Suicidal thoughts 0  Difficult doing work/chores Very difficult   Pap Smear (every 52yrs for >21-29 without HPV, every 1467yrs for >30-22667yrs with HPV):needed  Vision:up to date.  Dental:needed, no insurance at this time.  Advanced Directive: Advanced Directives 02/01/2016  Does Patient Have a Medical Advance Directive? No  Would patient like information on creating a medical advance directive? No - Patient declined   Medications and allergies reviewed with patient and updated if appropriate.  Patient Active Problem List   Diagnosis Date Noted  . GERD (gastroesophageal reflux disease) 04/07/2017  . Kidney stone 09/05/2015  . Renal colic on right side 09/05/2015    Current Outpatient Medications on File Prior to Visit  Medication Sig Dispense Refill  . albuterol (PROVENTIL HFA;VENTOLIN HFA) 108 (90 Base) MCG/ACT inhaler Inhale into the lungs every 6 (six) hours as needed for wheezing or shortness of breath.    . EPINEPHrine 0.3 mg/0.3 mL IJ SOAJ injection Inject 0.3 mg into the muscle once.   1  . levocetirizine (XYZAL) 5 MG tablet Take 5 mg by mouth  every evening.    . montelukast (SINGULAIR) 10 MG tablet Take 10 mg by mouth at bedtime.    . fluticasone furoate-vilanterol (BREO ELLIPTA) 100-25 MCG/INH AEPB Inhale 1 puff into the lungs daily.    . SUMAtriptan (IMITREX) 25 MG tablet Take 25 mg by mouth every 2 (two) hours as needed for migraine. May repeat in 2 hours if headache persists or  recurs.     No current facility-administered medications on file prior to visit.     Past Medical History:  Diagnosis Date  . Asthma    last attack 05/2015  . Depression   . Family history of adverse reaction to anesthesia    mother has nausea and difficulty waking up  . Frequent headaches   . Frequent UTI   . Headache    migraines  . History of chickenpox   . History of colon polyps   . History of kidney stones   . Kidney calculus    multiple stone HX  . Renal disorder     Past Surgical History:  Procedure Laterality Date  . ABLATION    . CYSTOSCOPY W/ URETEROSCOPY    . CYSTOSCOPY WITH RETROGRADE PYELOGRAM, URETEROSCOPY AND STENT PLACEMENT  04/09/2012   Procedure: CYSTOSCOPY WITH RETROGRADE PYELOGRAM, URETEROSCOPY AND STENT PLACEMENT;  Surgeon: Marcine Matar, MD;  Location: [facility];  Service: Urology;  Laterality: Right;  . CYSTOSCOPY/URETEROSCOPY/HOLMIUM LASER/STENT PLACEMENT Right 02/01/2016   Procedure: CYSTOSCOPY/RIGHT URETEROSCOPY/ HOLMIUM LASER/ RIGHT STENT PLACEMENT;  Surgeon: Bjorn Pippin, MD;  Location: [facility];  Service: Urology;  Laterality: Right;  . HOLMIUM LASER APPLICATION  04/09/2012   Procedure: HOLMIUM LASER APPLICATION;  Surgeon: Marcine Matar, MD;  Location: [facility];  Service: Urology;  Laterality: Right;  . LITHOTRIPSY  2004  . TUBAL LIGATION  2005    Social History   Socioeconomic History  . Marital status: Divorced    Spouse name: None  . Number of children: None  . Years of education: None  . Highest education level: None  Social Needs  . Financial resource strain: None  . Food insecurity - worry: None  . Food insecurity - inability: None  . Transportation needs - medical: None  . Transportation needs - non-medical: None  Occupational History  . None  Tobacco Use  . Smoking status: Never Smoker  . Smokeless tobacco: Never Used  Substance and Sexual Activity  . Alcohol use: Yes    Comment: social    . Drug use: No  . Sexual activity: None  Other Topics Concern  . None  Social History Narrative  . None    Family History  Problem Relation Age of Onset  . Alcohol abuse Mother   . Arthritis Mother   . Asthma Mother   . Depression Mother   . Hypertension Mother   . Alcohol abuse Father   . Asthma Father   . Hypertension Father   . Learning disabilities Father   . Miscarriages / Stillbirths Sister   . Depression Brother   . Early death Brother   . Learning disabilities Brother   . Mental illness Brother   . Bipolar disorder Brother   . Suicidality Brother   . Asthma Daughter   . Depression Son   . Learning disabilities Son   . Mental illness Son   . Arthritis Maternal Grandmother   . Heart disease Maternal Grandmother   . Hypertension Maternal Grandmother   . Mental illness Maternal Grandfather   . Cancer Paternal  Grandmother        breast cancer  . Hypertension Paternal Grandmother   . Hypertension Paternal Grandfather   . Asthma Brother         Review of Systems  Constitutional: Negative for chills, fever, malaise/fatigue and weight loss.  HENT: Negative for congestion and sore throat.   Eyes:       Negative for visual changes  Respiratory: Negative for cough and shortness of breath.   Cardiovascular: Negative for chest pain, palpitations and leg swelling.  Gastrointestinal: Negative for abdominal pain, anorexia, blood in stool, constipation, diarrhea and heartburn.  Genitourinary: Positive for menorrhagia and vaginal bleeding. Negative for dysuria, flank pain, frequency, missed menses, pelvic pain, urgency and vaginal discharge.  Musculoskeletal: Negative for back pain, falls, joint pain and myalgias.  Skin: Negative for rash.  Neurological: Negative for dizziness, sensory change and headaches.  Endo/Heme/Allergies: Does not bruise/bleed easily.  Psychiatric/Behavioral: Positive for depression. Negative for substance abuse and suicidal ideas. The patient is  not nervous/anxious and does not have insomnia.     Objective:   Vitals:   05/05/17 1310  BP: 110/72  Pulse: 90  Temp: 98.4 F (36.9 C)  SpO2: 99%    Body mass index is 33.15 kg/m.   Physical Examination:  Physical Exam  Constitutional: She is oriented to person, place, and time and well-developed, well-nourished, and in no distress. No distress.  HENT:  Right Ear: External ear normal.  Left Ear: External ear normal.  Nose: Nose normal.  Mouth/Throat: No oropharyngeal exudate.  Eyes: Conjunctivae and EOM are normal. Pupils are equal, round, and reactive to light. No scleral icterus.  Neck: Normal range of motion. Neck supple. No thyromegaly present.  Cardiovascular: Normal rate, regular rhythm, normal heart sounds and intact distal pulses.  Pulmonary/Chest: Effort normal and breath sounds normal. Right breast exhibits no mass, no nipple discharge and no skin change. Left breast exhibits no mass, no nipple discharge and no skin change. Breasts are symmetrical.  Abdominal: Soft. Bowel sounds are normal. She exhibits no distension. There is no tenderness.  Genitourinary: Rectum normal, cervix normal, right adnexa normal, left adnexa normal and vulva normal. Rectal exam shows no external hemorrhoid. Uterus is tender. Cervix exhibits no motion tenderness. Thin  odorless  clear and vaginal discharge found.  Musculoskeletal: Normal range of motion. She exhibits no edema or tenderness.  Lymphadenopathy:    She has no cervical adenopathy.  Neurological: She is alert and oriented to person, place, and time. Gait normal.  Skin: Skin is warm and dry.  Psychiatric: Affect and judgment normal.  Vitals reviewed.   ASSESSMENT and PLAN:  Willona was seen today for annual exam.  Diagnoses and all orders for this visit:  Well woman exam with routine gynecological exam -     CBC -     Comprehensive metabolic panel -     TSH -     Lipid panel -     Cytology - PAP  Gastroesophageal  reflux disease with esophagitis -     ranitidine (ZANTAC) 150 MG tablet; Take 1 tablet (150 mg total) by mouth 2 (two) times daily.  Encounter for Papanicolaou smear for cervical cancer screening -     Cytology - PAP  Encounter for lipid screening for cardiovascular disease -     Lipid panel  Hyperglycemia -     Hemoglobin A1c  Vaginal bleeding, abnormal -     US Pelvis Complete; Future -     Cancel: US Transvaginal  Non-OB; Future -     US PELVIC COMPLETE WITH TRANSVAGINAL; Future  Depression, recurrent (HCC) -     DULoxetine (CYMBALTA) 30 MG capsule; Take 1 capsule (30 mg total) by mouth daily.   No problem-specific Assessment & Plan notes found for this encounter.    Recent Results (from the past 2160 hour(s))  CBC     Status: Abnormal   Collection Time: 05/05/17  1:50 PM  Result Value Ref Range   WBC 6.3 4.0 - 10.5 K/uL   RBC 4.10 3.87 - 5.11 Mil/uL   Platelets 242.0 150.0 - 400.0 K/uL   Hemoglobin 12.4 12.0 - 15.0 g/dL   HCT 40.9 (L) 81.1 - 91.4 %   MCV 86.9 78.0 - 100.0 fl   MCHC 34.8 30.0 - 36.0 g/dL   RDW 78.2 95.6 - 21.3 %  Comprehensive metabolic panel     Status: None   Collection Time: 05/05/17  1:50 PM  Result Value Ref Range   Sodium 140 135 - 145 mEq/L   Potassium 3.8 3.5 - 5.1 mEq/L   Chloride 105 96 - 112 mEq/L   CO2 27 19 - 32 mEq/L   Glucose, Bld 81 70 - 99 mg/dL   BUN 19 6 - 23 mg/dL   Creatinine, Ser 0.86 0.40 - 1.20 mg/dL   Total Bilirubin 1.0 0.2 - 1.2 mg/dL   Alkaline Phosphatase 62 39 - 117 U/L   AST 13 0 - 37 U/L   ALT 16 0 - 35 U/L   Total Protein 6.6 6.0 - 8.3 g/dL   Albumin 4.1 3.5 - 5.2 g/dL   Calcium 9.4 8.4 - 57.8 mg/dL   GFR 46.96 >29.52 mL/min  TSH     Status: None   Collection Time: 05/05/17  1:50 PM  Result Value Ref Range   TSH 1.41 0.35 - 4.50 uIU/mL  Lipid panel     Status: Abnormal   Collection Time: 05/05/17  1:50 PM  Result Value Ref Range   Cholesterol 182 0 - 200 mg/dL    Comment: ATP III Classification        Desirable:  < 200 mg/dL               Borderline High:  200 - 239 mg/dL          High:  > = 841 mg/dL   Triglycerides 32.4 0.0 - 149.0 mg/dL    Comment: Normal:  <401 mg/dLBorderline High:  150 - 199 mg/dL   HDL 02.72 (L) >53.66 mg/dL   VLDL 44.0 0.0 - 34.7 mg/dL   LDL Cholesterol 425 (H) 0 - 99 mg/dL   Total CHOL/HDL Ratio 5     Comment:                Men          Women1/2 Average Risk     3.4          3.3Average Risk          5.0          4.42X Average Risk          9.6          7.13X Average Risk          15.0          11.0                       NonHDL 146.53     Comment: NOTE:  Non-HDL goal should be 30 mg/dL higher than patient's LDL goal (i.e. LDL goal of < 70 mg/dL, would have non-HDL goal of < 100 mg/dL)  Hemoglobin Z6X     Status: None   Collection Time: 05/05/17  1:50 PM  Result Value Ref Range   Hgb A1c MFr Bld 4.9 4.6 - 6.5 %    Comment: Glycemic Control Guidelines for People with Diabetes:Non Diabetic:  <6%Goal of Therapy: <7%Additional Action Suggested:  >8%    Follow up: Return in about 6 months (around 11/05/2017).  Alysia Penna, NP

## 2017-05-08 ENCOUNTER — Encounter: Payer: Self-pay | Admitting: Nurse Practitioner

## 2017-05-08 DIAGNOSIS — F339 Major depressive disorder, recurrent, unspecified: Secondary | ICD-10-CM | POA: Insufficient documentation

## 2017-05-08 DIAGNOSIS — R739 Hyperglycemia, unspecified: Secondary | ICD-10-CM | POA: Insufficient documentation

## 2017-05-08 DIAGNOSIS — N939 Abnormal uterine and vaginal bleeding, unspecified: Secondary | ICD-10-CM | POA: Insufficient documentation

## 2017-05-09 LAB — CYTOLOGY - PAP
Diagnosis: NEGATIVE
HPV (WINDOPATH): NOT DETECTED

## 2017-06-15 ENCOUNTER — Ambulatory Visit: Payer: 59 | Admitting: Allergy and Immunology

## 2017-06-15 ENCOUNTER — Encounter: Payer: Self-pay | Admitting: Allergy and Immunology

## 2017-06-15 VITALS — BP 102/82 | HR 68 | Temp 98.6°F | Resp 20 | Ht 67.7 in | Wt 215.8 lb

## 2017-06-15 DIAGNOSIS — H1010 Acute atopic conjunctivitis, unspecified eye: Secondary | ICD-10-CM | POA: Diagnosis not present

## 2017-06-15 DIAGNOSIS — J3089 Other allergic rhinitis: Secondary | ICD-10-CM

## 2017-06-15 DIAGNOSIS — J4520 Mild intermittent asthma, uncomplicated: Secondary | ICD-10-CM | POA: Diagnosis not present

## 2017-06-15 MED ORDER — MONTELUKAST SODIUM 10 MG PO TABS: 10.0000 mg | ORAL_TABLET | Freq: Every day | ORAL | 1 refills | Status: AC

## 2017-06-15 MED ORDER — ALBUTEROL SULFATE HFA 108 (90 BASE) MCG/ACT IN AERS: INHALATION_SPRAY | RESPIRATORY_TRACT | 1 refills | Status: AC

## 2017-06-15 MED ORDER — OLOPATADINE HCL 0.2 % OP SOLN: OPHTHALMIC | 1 refills | Status: AC

## 2017-06-15 MED FILL — MONTELUKAST SOD 10 MG TAB: 10 | 90 days supply | Qty: 90 | Fill #0

## 2017-06-15 MED FILL — PROAIR HFA 90 MCG INHALER: 108 (90 BAS | 17 days supply | Qty: 9 | Fill #0

## 2017-06-15 MED FILL — OLOPATADINE HCL 0.2% EYE DR: 0.2 | 90 days supply | Qty: 8 | Fill #0

## 2017-06-15 NOTE — Progress Notes (Signed)
Dear Monica Williams,  Thank you for referring Monica [facility] to the [facility] [place] on 06/15/2017.   Below is a summation of this patient's evaluation and recommendations.  Thank you for your referral. I will keep you informed about this patient's response to treatment.   If you have any questions please do not hesitate to contact me.   Sincerely,  Jessica PriestEric J. Kozlow, MD [facility]   ______________________________________________________________________    NEW PATIENT NOTE  Referring Provider: Anne Williams, Monica Lum, NP Primary Provider: Anne Williams, Monica Lum, NP Date of office visit: 06/15/2017    Subjective:   Chief Complaint:  Monica Williams (DOB: 02/28/1976) is a 42 y.o. female who presents to the clinic on 06/15/2017 with a chief complaint of Nasal Congestion and Cough .     HPI: Monica Williams presents to this clinic in evaluation of allergic disease.  Since around the age of 42 she has had problems with nasal congestion and sneezing and itchy red watery eyes and itchy throat and intermittent asthma manifested as coughing and chest tightness usually occurring from February through June of the year with minimal activity during the rest of the year.  Provoking factors for her symptoms include exposure to the outdoors.  She has tried a multitude of different medications including Singulair and Xyzal presently which does help her somewhat.  The biggest trigger for her asthma appears to be not just pollen exposure but also the development of upper respiratory tract infection.  Her requirement for short acting bronchodilator is minimal and even during the spring time it averages out to about 1 time per week.  She can exercise for the most part and does not appear to have any significant current nocturnal bronchospastic symptoms.  She did visit with [place] local allergist in [place] who  documented significant hypersensitivity against various [place]eroallergens located in [place] and started immunotherapy but because of a logistical issue she could only complete 6 months of immunotherapy.  She is now working in the [employer] office and would like to reinitiate immunotherapy.  Past Medical History:  Diagnosis Date  . Asthma    last attack 05/2015  . Depression   . Family history of adverse reaction to anesthesia    mother has nausea and difficulty waking up  . Frequent headaches   . Frequent UTI   . Headache    migraines  . History of chickenpox   . History of colon polyps   . History of kidney stones   . Kidney calculus    multiple stone HX  . Renal disorder     Past Surgical History:  Procedure Laterality Date  . ABLATION    . CYSTOSCOPY W/ URETEROSCOPY    . CYSTOSCOPY WITH RETROGRADE PYELOGRAM, URETEROSCOPY AND STENT PLACEMENT  04/09/2012   Procedure: CYSTOSCOPY WITH RETROGRADE PYELOGRAM, URETEROSCOPY AND STENT PLACEMENT;  Surgeon: Marcine MatarStephen Dahlstedt, MD;  [facility];  Service: Urology;  Laterality: Right;  . CYSTOSCOPY/URETEROSCOPY/HOLMIUM LASER/STENT PLACEMENT Right 02/01/2016   Procedure: CYSTOSCOPY/RIGHT URETEROSCOPY/ HOLMIUM LASER/ RIGHT STENT PLACEMENT;  Surgeon: Bjorn PippinJohn Wrenn, MD;  Location: [facility];  Service: Urology;  Laterality: Right;  . HOLMIUM LASER APPLICATION  04/09/2012   Procedure: HOLMIUM LASER APPLICATION;  Surgeon: Marcine MatarStephen Dahlstedt, MD;  [facility] ;  Service: Urology;  Laterality: Right;  . LITHOTRIPSY  2004  . TUBAL LIGATION  2005    Allergies as  of 06/15/2017      Reactions   Penicillins Hives   Has patient had a PCN reaction causing immediate rash, facial/tongue/throat swelling, SOB or lightheadedness with hypotension: No Has patient had a PCN reaction causing severe rash involving mucus membranes or skin necrosis: No Has patient had a PCN reaction that required hospitalization No Has patient  had a PCN reaction occurring within the last 10 years: No If all of the above answers are "NO", then may proceed with Cephalosporin use.   Sulfa Antibiotics Hives      Medication List      ADVIL PO Take by mouth.   DULoxetine 30 MG capsule Commonly known as:  CYMBALTA Take 1 capsule (30 mg total) by mouth daily.   EXCEDRIN MIGRAINE PO Take by mouth.   ranitidine 150 MG tablet Commonly known as:  ZANTAC Take 1 tablet (150 mg total) by mouth 2 (two) times daily.       Review of systems negative except as noted in HPI / PMHx or noted below:  Review of Systems  Constitutional: Negative.   HENT: Negative.   Eyes: Negative.   Respiratory: Negative.   Cardiovascular: Negative.   Gastrointestinal: Negative.   Genitourinary: Negative.   Musculoskeletal: Negative.   Skin: Negative.   Neurological: Negative.   Endo/Heme/Allergies: Negative.   Psychiatric/Behavioral: Negative.     Family History  Problem Relation Age of Onset  . Alcohol abuse Mother   . Arthritis Mother   . Asthma Mother   . Depression Mother   . Hypertension Mother   . Alcohol abuse Father   . Asthma Father   . Hypertension Father   . Learning disabilities Father   . Miscarriages / Stillbirths Sister   . Depression Brother   . Early death Brother   . Learning disabilities Brother   . Mental illness Brother   . Bipolar disorder Brother   . Suicidality Brother   . Asthma Daughter   . Depression Son   . Learning disabilities Son   . Mental illness Son   . Arthritis Maternal Grandmother   . Heart disease Maternal Grandmother   . Hypertension Maternal Grandmother   . Mental illness Maternal Grandfather   . Cancer Paternal Grandmother        breast cancer  . Hypertension Paternal Grandmother   . Hypertension Paternal Grandfather   . Asthma Brother     Social History   Socioeconomic History  . Marital status: Divorced    Spouse name: Not on file  . Number of children: Not on file  .  Years of education: Not on file  . Highest education level: Not on file  Occupational History  . Not on file  Social Needs  . Financial resource strain: Not on file  . Food insecurity:    Worry: Not on file    Inability: Not on file  . Transportation needs:    Medical: Not on file    Non-medical: Not on file  Tobacco Use  . Smoking status: Never Smoker  . Smokeless tobacco: Never Used  Substance and Sexual Activity  . Alcohol use: Yes    Comment: social  . Drug use: No  . Sexual activity: Not on file  Lifestyle  . Physical activity:    Days per week: Not on file    Minutes per session: Not on file  . Stress: Not on file  Relationships  . Social connections:    Talks on phone: Not on file  Gets together: Not on file    Attends religious service: Not on file    Active member of club or organization: Not on file    Attends meetings of clubs or organizations: Not on file    Relationship status: Not on file  . Intimate partner violence:    Fear of current or ex partner: Not on file    Emotionally abused: Not on file    Physically abused: Not on file    Forced sexual activity: Not on file  Other Topics Concern  . Not on file  Social History Narrative  . Not on file    Environmental and Social history  Lives in a house with a dry environment, cats and dogs located inside the household, carpet in the bedroom, no plastic on the bed, no plastic on the pillow, and no smokers located inside the household.  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  Objective:   Vitals:   06/15/17 1350  BP: 102/82  Pulse: 68  Resp: 20  Temp: 98.6 F (37 C)   Height: 5' 7.7" (172 cm) Weight: 215 lb 12.8 oz (97.9 kg)  Physical Exam  HENT:  Head: Normocephalic. Head is without right periorbital erythema and without left periorbital erythema.  Right Ear: Tympanic membrane, external ear and ear canal normal.  Left Ear: Tympanic membrane, external ear and ear  canal normal.  Nose: Nose normal. No mucosal edema or rhinorrhea.  Mouth/Throat: Oropharynx is clear and moist and mucous membranes are normal. No oropharyngeal exudate.  Eyes: Pupils are equal, round, and reactive to light. Conjunctivae and lids are normal.  Neck: Trachea normal. No tracheal deviation present. No thyromegaly present.  Cardiovascular: Normal rate, regular rhythm, S1 normal, S2 normal and normal heart sounds.  No murmur heard. Pulmonary/Chest: Effort normal. No stridor. No respiratory distress. She has no wheezes. She has no rales. She exhibits no tenderness.  Abdominal: Soft. She exhibits no distension and no mass. There is no hepatosplenomegaly. There is no tenderness. There is no rebound and no guarding.  Musculoskeletal: She exhibits no edema or tenderness.  Lymphadenopathy:       Head (right side): No tonsillar adenopathy present.       Head (left side): No tonsillar adenopathy present.    She has no cervical adenopathy.    She has no axillary adenopathy.  Neurological: She is alert.  Skin: No rash noted. She is not diaphoretic. No erythema. No pallor. Nails show no clubbing.    Diagnostics: [facility] skin tests were not performed.  Review of skin test results dated 16 June 2015 documents significant hypersensitivity against grasses including Southern grasses, [place] grass, and Johnson grass, significant sensitivity directed against weed mix and English planting, significant hypersensitivity against trees including tree mix, Oak, [place], [place], [place], black walnut and pecan, house dust mite, and slight hypersensitivity noted on intradermal skin testing directed against dog and cat and cockroach.  Spirometry was performed and demonstrated an FEV1 of 3.39 @ 101 % of predicted. FEV1/FVC = 0.85  Assessment and Plan:    1. Asthma, mild intermittent, well-controlled   2. Other allergic rhinitis   3. Seasonal allergic conjunctivitis     1. Perform allergen  avoidance measures as best as possible  2.  Treat and prevent inflammation:   A.  OTC Nasacort/Rhinocort 1 spray each nostril 1 time per day  B.  Montelukast 10 mg tablet 1 time per day  3.  If  needed:   A.  Xyzal 5 mg 1 tablet 1 time per day  B.  Pro Air HFA or similar 2 inhalations every 4-6 hours  C.  Nasal saline spray  D.  Pataday 1 drop each eye one time per day  4.  Start a course of immunotherapy  5.  Return to clinic summer 2019 or earlier if problem  Nateisha has multiorgan atopic disease and she will try to perform allergen avoidance measures and utilize anti-inflammatory medications with a combination of a nasal steroid and a leukotriene modifier and start a course of immunotherapy.  Assuming she does well we will see her back in this clinic in the summer 2019 or earlier if there is a problem.  Jessica Priest, MD [facility] [place]

## 2017-06-15 NOTE — Patient Instructions (Addendum)
  1. Perform allergen avoidance measures as best as possible  2.  Treat and prevent inflammation:   A.  OTC Nasacort/Rhinocort 1 spray each nostril 1 time per day  B.  Montelukast 10 mg tablet 1 time per day  3.  If needed:   A.  Xyzal 5 mg 1 tablet 1 time per day  B.  Pro Air HFA or similar 2 inhalations every 4-6 hours  C.  Nasal saline spray  D.  Pataday 1 drop each eye one time per day  4.  Start a course of immunotherapy  5.  Return to clinic summer 2019 or earlier if problem

## 2017-06-19 ENCOUNTER — Encounter: Payer: Self-pay | Admitting: Allergy and Immunology

## 2017-08-14 MED FILL — raNITIdine HCL 150 MG TABS: 150 | 30 days supply | Qty: 60 | Fill #1

## 2017-08-14 MED FILL — DULoxetine HCL 30 MG CPEP: 30 | 90 days supply | Qty: 90 | Fill #1

## 2017-08-29 ENCOUNTER — Encounter (HOSPITAL_COMMUNITY): Payer: Self-pay | Admitting: Emergency Medicine

## 2017-08-29 ENCOUNTER — Other Ambulatory Visit: Payer: Self-pay

## 2017-08-29 ENCOUNTER — Emergency Department (HOSPITAL_COMMUNITY)
Admission: EM | Admit: 2017-08-29 | Discharge: 2017-08-30 | Disposition: A | Discharge status: Home / Self Care | Payer: No Typology Code available for payment source | Attending: Emergency Medicine | Admitting: Emergency Medicine

## 2017-08-29 DIAGNOSIS — J45909 Unspecified asthma, uncomplicated: Secondary | ICD-10-CM | POA: Diagnosis not present

## 2017-08-29 DIAGNOSIS — N200 Calculus of kidney: Secondary | ICD-10-CM | POA: Insufficient documentation

## 2017-08-29 DIAGNOSIS — Z79899 Other long term (current) drug therapy: Secondary | ICD-10-CM | POA: Diagnosis not present

## 2017-08-29 DIAGNOSIS — R1011 Right upper quadrant pain: Secondary | ICD-10-CM | POA: Diagnosis present

## 2017-08-29 NOTE — ED Triage Notes (Signed)
Pt arriving with right sided flank pain that began around 3pm. Pt has hx of kidney stones. Pt took a (5-325) Vicodin at 5pm and 7pm with no relief.

## 2017-08-30 ENCOUNTER — Emergency Department (HOSPITAL_COMMUNITY): Payer: No Typology Code available for payment source

## 2017-08-30 LAB — CBC WITH DIFFERENTIAL/PLATELET
BASOS ABS: 0 10*3/uL (ref 0.0–0.1)
Basophils Relative: 0 %
EOS ABS: 0.2 10*3/uL (ref 0.0–0.7)
Eosinophils Relative: 1 %
HCT: 35.8 % — ABNORMAL LOW (ref 36.0–46.0)
HEMOGLOBIN: 12.6 g/dL (ref 12.0–15.0)
LYMPHS ABS: 1.6 10*3/uL (ref 0.7–4.0)
Lymphocytes Relative: 12 %
MCH: 31 pg (ref 26.0–34.0)
MCHC: 35.2 g/dL (ref 30.0–36.0)
MCV: 88.2 fL (ref 78.0–100.0)
Monocytes Absolute: 0.7 10*3/uL (ref 0.1–1.0)
Monocytes Relative: 5 %
NEUTROS PCT: 82 %
Neutro Abs: 11.1 10*3/uL — ABNORMAL HIGH (ref 1.7–7.7)
Platelets: 235 10*3/uL (ref 150–400)
RBC: 4.06 MIL/uL (ref 3.87–5.11)
RDW: 12.9 % (ref 11.5–15.5)
WBC: 13.6 10*3/uL — AB (ref 4.0–10.5)

## 2017-08-30 LAB — URINALYSIS, ROUTINE W REFLEX MICROSCOPIC
BACTERIA UA: NONE SEEN
BILIRUBIN URINE: NEGATIVE
Glucose, UA: NEGATIVE mg/dL
Ketones, ur: NEGATIVE mg/dL
LEUKOCYTES UA: NEGATIVE
Nitrite: POSITIVE — AB
PROTEIN: NEGATIVE mg/dL
RBC / HPF: >50 RBC/hpf — ABNORMAL HIGH (ref 0–5)
SPECIFIC GRAVITY, URINE: 1.024 (ref 1.005–1.030)
pH: 5 (ref 5.0–8.0)

## 2017-08-30 LAB — I-STAT BETA HCG BLOOD, ED (MC, WL, AP ONLY): I-stat hCG, quantitative: <5 m[IU]/mL (ref <5)

## 2017-08-30 LAB — BASIC METABOLIC PANEL
ANION GAP: 5 (ref 5–15)
BUN: 22 mg/dL — ABNORMAL HIGH (ref 6–20)
CHLORIDE: 111 mmol/L (ref 98–111)
CO2: 26 mmol/L (ref 22–32)
Calcium: 9 mg/dL (ref 8.9–10.3)
Creatinine, Ser: 0.99 mg/dL (ref 0.44–1.00)
GFR calc Af Amer: >60 mL/min (ref >60)
GFR calc non Af Amer: >60 mL/min (ref >60)
Glucose, Bld: 118 mg/dL — ABNORMAL HIGH (ref 70–99)
Potassium: 3.8 mmol/L (ref 3.5–5.1)
SODIUM: 142 mmol/L (ref 135–145)

## 2017-08-30 LAB — LIPASE, BLOOD: LIPASE: 30 U/L (ref 11–51)

## 2017-08-30 MED ORDER — HYDROMORPHONE HCL 1 MG/ML IJ SOLN
1.0000 mg | Freq: Once | INTRAMUSCULAR | Status: AC
  Administered 2017-08-30: 1 mg via INTRAVENOUS
  Filled 2017-08-30: qty 1

## 2017-08-30 MED ORDER — GI COCKTAIL ~~LOC~~
30.0000 mL | Freq: Once | ORAL | Status: AC
  Administered 2017-08-30: 30 mL via ORAL
  Filled 2017-08-30: qty 30

## 2017-08-30 MED ORDER — ONDANSETRON HCL 4 MG/2ML IJ SOLN
4.0000 mg | Freq: Once | INTRAMUSCULAR | Status: AC
  Administered 2017-08-30: 4 mg via INTRAVENOUS
  Filled 2017-08-30: qty 2

## 2017-08-30 MED ORDER — KETOROLAC TROMETHAMINE 30 MG/ML IJ SOLN
30.0000 mg | Freq: Once | INTRAMUSCULAR | Status: AC
  Administered 2017-08-30: 30 mg via INTRAVENOUS
  Filled 2017-08-30: qty 1

## 2017-08-30 MED ORDER — HYDROCODONE-ACETAMINOPHEN 5-325 MG PO TABS: 1.0000 | ORAL_TABLET | Freq: Four times a day (QID) | ORAL | 0 refills | Status: DC | PRN

## 2017-08-30 NOTE — ED Notes (Signed)
Pt provided urine sample for U/A but there was not enough urine to run lab. Pt advised of same and will attempt to provide more urine when she can. RN notified

## 2017-08-30 NOTE — ED Provider Notes (Signed)
[facility]-EMERGENCY DEPT Provider Note   CSN: [id number] Arrival date & time: 08/29/17  2316     History   Chief Complaint Chief Complaint  Patient presents with  . Flank Pain    right    HPI Monica Williams is a 42 y.o. female.  Patient presents to the emergency department with a chief complaint of right-sided flank pain.  Onset of symptoms was at 3 PM today.  She states that they started suddenly, but have gradually worsened.  She states that this feels similar to prior kidney stones.  She has not been able to pass a stone by herself without intervention.  She is seen by [facility].  She denies any fever, chills, cough.  Denies any chest pain or shortness of breath.  Denies any abdominal pain.  She has tried taking some Vicodin with little relief.  She reports some urinary hesitancy.  She endorses associated nausea, but has not had vomiting.  The history is provided by the patient. No language interpreter was used.    Past Medical History:  Diagnosis Date  . Asthma    last attack 05/2015  . Depression   . Family history of adverse reaction to anesthesia    mother has nausea and difficulty waking up  . Frequent headaches   . Frequent UTI   . Headache    migraines  . History of chickenpox   . History of colon polyps   . History of kidney stones   . Kidney calculus    multiple stone HX  . Renal disorder     Patient Active Problem List   Diagnosis Date Noted  . Vaginal bleeding, abnormal 05/08/2017  . Hyperglycemia 05/08/2017  . Depression, recurrent (HCC) 05/08/2017  . GERD (gastroesophageal reflux disease) 04/07/2017  . Kidney stone 09/05/2015  . Renal colic on right side 09/05/2015    Past Surgical History:  Procedure Laterality Date  . ABLATION    . CYSTOSCOPY W/ URETEROSCOPY    . CYSTOSCOPY WITH RETROGRADE PYELOGRAM, URETEROSCOPY AND STENT PLACEMENT  04/09/2012   Procedure: CYSTOSCOPY WITH RETROGRADE PYELOGRAM, URETEROSCOPY AND  STENT PLACEMENT;  Surgeon: Marcine MatarStephen Dahlstedt, MD;  [facility] [place];  Service: Urology;  Laterality: Right;  . CYSTOSCOPY/URETEROSCOPY/HOLMIUM LASER/STENT PLACEMENT Right 02/01/2016   Procedure: CYSTOSCOPY/RIGHT URETEROSCOPY/ HOLMIUM LASER/ RIGHT STENT PLACEMENT;  Surgeon: Bjorn PippinJohn Wrenn, MD;  Location: [facility];  Service: Urology;  Laterality: Right;  . HOLMIUM LASER APPLICATION  04/09/2012   Procedure: HOLMIUM LASER APPLICATION;  Surgeon: Marcine MatarStephen Dahlstedt, MD;  [facility] [place];  Service: Urology;  Laterality: Right;  . LITHOTRIPSY  2004  . TUBAL LIGATION  2005     OB History   None      Home Medications    Prior to Admission medications   Medication Sig Start Date End Date Taking? Authorizing Provider  albuterol (PROAIR HFA) 108 (90 Base) MCG/ACT inhaler Inhale two puffs every four to six hours as needed for cough or wheeze. 06/15/17   Kozlow, Alvira PhilipsEric J, MD  Aspirin-Acetaminophen-Caffeine (EXCEDRIN MIGRAINE PO) Take by mouth.    [provider]  DULoxetine (CYMBALTA) 30 MG capsule Take 1 capsule (30 mg total) by mouth daily. 05/05/17   Nche, Bonna Gainsharlotte Lum, NP  Ibuprofen (ADVIL PO) Take by mouth.    [provider]  montelukast (SINGULAIR) 10 MG tablet Take 1 tablet (10 mg total) by mouth at bedtime. 06/15/17   Kozlow, Alvira PhilipsEric J, MD  Olopatadine HCl 0.2 % SOLN Can  use one drop in each eye once daily as needed for itchy eyes. 06/15/17   Kozlow, Alvira Philips, MD  ranitidine (ZANTAC) 150 MG tablet Take 1 tablet (150 mg total) by mouth 2 (two) times daily. 05/05/17   Nche, Bonna Gains, NP    Family History Family History  Problem Relation Age of Onset  . Alcohol abuse Mother   . Arthritis Mother   . Asthma Mother   . Depression Mother   . Hypertension Mother   . Alcohol abuse Father   . Asthma Father   . Hypertension Father   . Learning disabilities Father   . Miscarriages / Stillbirths Sister   . Depression Brother   . Early death Brother     . Learning disabilities Brother   . Mental illness Brother   . Bipolar disorder Brother   . Suicidality Brother   . Asthma Daughter   . Depression Son   . Learning disabilities Son   . Mental illness Son   . Arthritis Maternal Grandmother   . Heart disease Maternal Grandmother   . Hypertension Maternal Grandmother   . Mental illness Maternal Grandfather   . Cancer Paternal Grandmother        breast cancer  . Hypertension Paternal Grandmother   . Hypertension Paternal Grandfather   . Asthma Brother     Social History Social History   Tobacco Use  . Smoking status: Never Smoker  . Smokeless tobacco: Never Used  Substance Use Topics  . Alcohol use: Yes    Comment: social  . Drug use: No     Allergies   Penicillins and Sulfa antibiotics   Review of Systems Review of Systems  All other systems reviewed and are negative.    Physical Exam Updated Vital Signs BP 122/84 (BP Location: Left Arm)   Pulse 75   Temp 97.8 F (36.6 C) (Oral)   Resp 18   Ht 5\' 7"  (1.702 m)   Wt 104.3 kg (230 lb)   SpO2 98%   BMI 36.02 kg/m   Physical Exam  Constitutional: She is oriented to person, place, and time. She appears well-developed and well-nourished.  HENT:  Head: Normocephalic and atraumatic.  Eyes: Pupils are equal, round, and reactive to light. Conjunctivae and EOM are normal.  Neck: Normal range of motion. Neck supple.  Cardiovascular: Normal rate and regular rhythm. Exam reveals no gallop and no friction rub.  No murmur heard. Pulmonary/Chest: Effort normal and breath sounds normal. No respiratory distress. She has no wheezes. She has no rales. She exhibits no tenderness.  Abdominal: Soft. Bowel sounds are normal. She exhibits no distension and no mass. There is no tenderness. There is no rebound and no guarding.  Positive right-sided CVA tenderness No focal abdominal tenderness  Musculoskeletal: Normal range of motion. She exhibits no edema or tenderness.   Neurological: She is alert and oriented to person, place, and time.  Skin: Skin is warm and dry.  Psychiatric: She has a normal mood and affect. Her behavior is normal. Judgment and thought content normal.  Nursing note and vitals reviewed.    ED Treatments / Results  Labs (all labs ordered are listed, but only abnormal results are displayed) Labs Reviewed  URINALYSIS, ROUTINE W REFLEX MICROSCOPIC - Abnormal; Notable for the following components:      Result Value   Color, Urine AMBER (*)    Hgb urine dipstick MODERATE (*)    Nitrite POSITIVE (*)    RBC / HPF >50 (*)  All other components within normal limits  CBC WITH DIFFERENTIAL/PLATELET - Abnormal; Notable for the following components:   WBC 13.6 (*)    HCT 35.8 (*)    Neutro Abs 11.1 (*)    All other components within normal limits  BASIC METABOLIC PANEL - Abnormal; Notable for the following components:   Glucose, Bld 118 (*)    BUN 22 (*)    All other components within normal limits  LIPASE, BLOOD  I-STAT BETA HCG BLOOD, ED ([facility], [facility], [facility] ONLY)    EKG None  Radiology Ct Renal Stone Study  Result Date: 08/30/2017 CLINICAL DATA:  42 year old female with right flank pain. EXAM: CT ABDOMEN AND PELVIS WITHOUT CONTRAST TECHNIQUE: Multidetector CT imaging of the abdomen and pelvis was performed following the standard protocol without IV contrast. COMPARISON:  CT of the abdomen pelvis dated 01/29/2016 FINDINGS: Evaluation of this exam is limited in the absence of intravenous contrast. Lower chest: The visualized lung bases are clear. No intra-abdominal free air or free fluid. Hepatobiliary: Mild fatty infiltration of the liver. An ill-defined 2 cm hypodense area in the left lobe of the liver (series 2, image 22) was present on the prior CT of 2017. This is indeterminate and not characterized on this study. There is no intrahepatic biliary ductal dilatation. The gallbladder is unremarkable. Pancreas: Unremarkable. No pancreatic  ductal dilatation or surrounding inflammatory changes. Spleen: Mild splenomegaly measuring 15 cm in length. Adrenals/Urinary Tract: The adrenal glands are unremarkable. There is a 3 mm stone in the distal right ureter with mild right hydronephrosis. Multiple bilateral nonobstructing renal calculi measure up to 7 mm in the inferior pole of the right kidney. There is no hydronephrosis on the left side. The left ureter and urinary bladder appear unremarkable. Stomach/Bowel: Small hiatal hernia. There is no bowel obstruction or active inflammation. There is colonic diverticulosis without active inflammatory changes. The appendix is normal. Vascular/Lymphatic: The abdominal aorta and IVC are unremarkable on this noncontrast study. No portal venous gas. There is no adenopathy. Reproductive: The uterus and ovaries are grossly unremarkable as visualized. Other: Small fat containing umbilical hernia. Musculoskeletal: None IMPRESSION: A 3 mm obstructing distal right ureteral stone with mild right hydronephrosis. Additional nonobstructing bilateral renal calculi. Electronically Signed   By: Elgie Collard M.D.   On: 08/30/2017 02:31    Procedures Procedures (including critical care time)  Medications Ordered in ED Medications  HYDROmorphone (DILAUDID) injection 1 mg (has no administration in time range)  ondansetron (ZOFRAN) injection 4 mg (has no administration in time range)     Initial Impression / Assessment and Plan / ED Course  I have reviewed the triage vital signs and the nursing notes.  Pertinent labs & imaging results that were available during my care of the patient were reviewed by me and considered in my medical decision making (see chart for details).    Patient with right flank pain.  Suspected renal stone.  Will check CT.  3 mm right ureteral stone seen on CT with mild hydronephrosis.  Urinalysis is without evidence of white blood cells, or bacteria.  Doubt infected stone or UTI.   Discussed with Dr. Nicanor Alcon, who agrees with plan for discharge.  Final Clinical Impressions(s) / ED Diagnoses   Final diagnoses:  Kidney stone    ED Discharge Orders        Ordered    HYDROcodone-acetaminophen (NORCO/VICODIN) 5-325 MG tablet  Every 6 hours PRN     08/30/17 0441  Roxy Horseman, PA-C 08/30/17 0443    Palumbo, April, MD 08/30/17 1610

## 2017-08-30 NOTE — ED Notes (Signed)
Pt vomiting, nacl fluids administered provider notified

## 2017-09-02 ENCOUNTER — Emergency Department (HOSPITAL_COMMUNITY): Payer: No Typology Code available for payment source

## 2017-09-02 ENCOUNTER — Emergency Department (HOSPITAL_COMMUNITY)
Admission: EM | Admit: 2017-09-02 | Discharge: 2017-09-02 | Disposition: A | Discharge status: Home / Self Care | Payer: No Typology Code available for payment source | Attending: Emergency Medicine | Admitting: Emergency Medicine

## 2017-09-02 ENCOUNTER — Encounter (HOSPITAL_COMMUNITY)
Admission: EM | Disposition: A | Discharge status: Home / Self Care | Payer: Self-pay | Source: Home / Self Care | Attending: Emergency Medicine

## 2017-09-02 ENCOUNTER — Emergency Department (HOSPITAL_COMMUNITY): Payer: No Typology Code available for payment source | Admitting: Certified Registered Nurse Anesthetist

## 2017-09-02 ENCOUNTER — Encounter (HOSPITAL_COMMUNITY): Payer: Self-pay

## 2017-09-02 ENCOUNTER — Other Ambulatory Visit: Payer: Self-pay

## 2017-09-02 DIAGNOSIS — N201 Calculus of ureter: Secondary | ICD-10-CM | POA: Diagnosis present

## 2017-09-02 DIAGNOSIS — K219 Gastro-esophageal reflux disease without esophagitis: Secondary | ICD-10-CM | POA: Insufficient documentation

## 2017-09-02 DIAGNOSIS — Z79899 Other long term (current) drug therapy: Secondary | ICD-10-CM | POA: Insufficient documentation

## 2017-09-02 DIAGNOSIS — Z87442 Personal history of urinary calculi: Secondary | ICD-10-CM | POA: Insufficient documentation

## 2017-09-02 DIAGNOSIS — N131 Hydronephrosis with ureteral stricture, not elsewhere classified: Secondary | ICD-10-CM | POA: Insufficient documentation

## 2017-09-02 DIAGNOSIS — J45909 Unspecified asthma, uncomplicated: Secondary | ICD-10-CM | POA: Diagnosis not present

## 2017-09-02 DIAGNOSIS — G43909 Migraine, unspecified, not intractable, without status migrainosus: Secondary | ICD-10-CM | POA: Insufficient documentation

## 2017-09-02 DIAGNOSIS — N1330 Unspecified hydronephrosis: Secondary | ICD-10-CM

## 2017-09-02 DIAGNOSIS — F329 Major depressive disorder, single episode, unspecified: Secondary | ICD-10-CM | POA: Insufficient documentation

## 2017-09-02 DIAGNOSIS — R109 Unspecified abdominal pain: Secondary | ICD-10-CM

## 2017-09-02 HISTORY — PX: CYSTOSCOPY/URETEROSCOPY/HOLMIUM LASER/STENT PLACEMENT: SHX6546

## 2017-09-02 LAB — CBC WITH DIFFERENTIAL/PLATELET
BASOS ABS: 0 10*3/uL (ref 0.0–0.1)
Basophils Relative: 0 %
EOS ABS: 0.1 10*3/uL (ref 0.0–0.7)
EOS PCT: 1 %
HCT: 32.9 % — ABNORMAL LOW (ref 36.0–46.0)
Hemoglobin: 11.3 g/dL — ABNORMAL LOW (ref 12.0–15.0)
LYMPHS ABS: 1 10*3/uL (ref 0.7–4.0)
LYMPHS PCT: 12 %
MCH: 30.2 pg (ref 26.0–34.0)
MCHC: 34.3 g/dL (ref 30.0–36.0)
MCV: 88 fL (ref 78.0–100.0)
MONO ABS: 0.7 10*3/uL (ref 0.1–1.0)
Monocytes Relative: 8 %
Neutro Abs: 6.9 10*3/uL (ref 1.7–7.7)
Neutrophils Relative %: 79 %
PLATELETS: 227 10*3/uL (ref 150–400)
RBC: 3.74 MIL/uL — ABNORMAL LOW (ref 3.87–5.11)
RDW: 12.5 % (ref 11.5–15.5)
WBC: 8.7 10*3/uL (ref 4.0–10.5)

## 2017-09-02 LAB — BASIC METABOLIC PANEL
Anion gap: 6 (ref 5–15)
BUN: 22 mg/dL — AB (ref 6–20)
CO2: 26 mmol/L (ref 22–32)
Calcium: 9 mg/dL (ref 8.9–10.3)
Chloride: 109 mmol/L (ref 98–111)
Creatinine, Ser: 1.3 mg/dL — ABNORMAL HIGH (ref 0.44–1.00)
GFR calc Af Amer: 57 mL/min — ABNORMAL LOW (ref >60)
GFR, EST NON AFRICAN AMERICAN: 49 mL/min — AB (ref >60)
GLUCOSE: 107 mg/dL — AB (ref 70–99)
POTASSIUM: 4.3 mmol/L (ref 3.5–5.1)
SODIUM: 141 mmol/L (ref 135–145)

## 2017-09-02 LAB — URINALYSIS, ROUTINE W REFLEX MICROSCOPIC
BACTERIA UA: NONE SEEN
Bilirubin Urine: NEGATIVE
GLUCOSE, UA: NEGATIVE mg/dL
KETONES UR: NEGATIVE mg/dL
Leukocytes, UA: NEGATIVE
NITRITE: NEGATIVE
PROTEIN: NEGATIVE mg/dL
Specific Gravity, Urine: 1.017 (ref 1.005–1.030)
pH: 6 (ref 5.0–8.0)

## 2017-09-02 SURGERY — CYSTOSCOPY/URETEROSCOPY/HOLMIUM LASER/STENT PLACEMENT
Anesthesia: General | Laterality: Right

## 2017-09-02 MED ORDER — OXYCODONE HCL 5 MG/5ML PO SOLN
5.0000 mg | Freq: Once | ORAL | Status: DC | PRN
  Filled 2017-09-02: qty 5

## 2017-09-02 MED ORDER — PROPOFOL 10 MG/ML IV BOLUS
INTRAVENOUS | Status: AC
  Filled 2017-09-02: qty 20

## 2017-09-02 MED ORDER — LACTATED RINGERS IV SOLN
INTRAVENOUS | Status: DC
  Administered 2017-09-02 (×2): via INTRAVENOUS

## 2017-09-02 MED ORDER — PROPOFOL 10 MG/ML IV BOLUS
INTRAVENOUS | Status: DC | PRN
  Administered 2017-09-02: 200 mg via INTRAVENOUS

## 2017-09-02 MED ORDER — CIPROFLOXACIN HCL 250 MG PO TABS: 250.0000 mg | ORAL_TABLET | Freq: Two times a day (BID) | ORAL | 0 refills | Status: DC

## 2017-09-02 MED ORDER — MEPERIDINE HCL 50 MG/ML IJ SOLN: 6.2500 mg | INTRAMUSCULAR | Status: DC | PRN

## 2017-09-02 MED ORDER — SUCCINYLCHOLINE CHLORIDE 20 MG/ML IJ SOLN
INTRAMUSCULAR | Status: DC | PRN
  Administered 2017-09-02: 120 mg via INTRAVENOUS

## 2017-09-02 MED ORDER — HYDROMORPHONE HCL 1 MG/ML IJ SOLN
0.5000 mg | Freq: Once | INTRAMUSCULAR | Status: AC
  Administered 2017-09-02: 0.5 mg via INTRAVENOUS
  Filled 2017-09-02: qty 1

## 2017-09-02 MED ORDER — PROMETHAZINE HCL 25 MG/ML IJ SOLN
12.5000 mg | Freq: Once | INTRAMUSCULAR | Status: AC
  Administered 2017-09-02: 12.5 mg via INTRAVENOUS
  Filled 2017-09-02: qty 1

## 2017-09-02 MED ORDER — MIDAZOLAM HCL 2 MG/2ML IJ SOLN
INTRAMUSCULAR | Status: AC
  Filled 2017-09-02: qty 2

## 2017-09-02 MED ORDER — HYDROCODONE-ACETAMINOPHEN 5-325 MG PO TABS: 1.0000 | ORAL_TABLET | ORAL | 0 refills | Status: DC | PRN

## 2017-09-02 MED ORDER — DIPHENHYDRAMINE HCL 25 MG PO CAPS
25.0000 mg | ORAL_CAPSULE | Freq: Once | ORAL | Status: AC
  Administered 2017-09-02: 25 mg via ORAL
  Filled 2017-09-02: qty 1

## 2017-09-02 MED ORDER — STERILE WATER FOR IRRIGATION IR SOLN
Status: DC | PRN
  Administered 2017-09-02: 3000 mL
  Administered 2017-09-02: 1000 mL

## 2017-09-02 MED ORDER — CIPROFLOXACIN IN D5W 400 MG/200ML IV SOLN
400.0000 mg | Freq: Once | INTRAVENOUS | Status: AC
  Administered 2017-09-02: 400 mg via INTRAVENOUS

## 2017-09-02 MED ORDER — SODIUM CHLORIDE 0.9 % IV SOLN
INTRAVENOUS | Status: DC | PRN
  Administered 2017-09-02: 50 mL

## 2017-09-02 MED ORDER — CEFAZOLIN (ANCEF) 1 G IV SOLR: 2.0000 g | INTRAVENOUS | Status: DC

## 2017-09-02 MED ORDER — ALBUTEROL SULFATE HFA 108 (90 BASE) MCG/ACT IN AERS
INHALATION_SPRAY | RESPIRATORY_TRACT | Status: DC | PRN
  Administered 2017-09-02: 5 via RESPIRATORY_TRACT

## 2017-09-02 MED ORDER — MIDAZOLAM HCL 5 MG/5ML IJ SOLN
INTRAMUSCULAR | Status: DC | PRN
  Administered 2017-09-02: 2 mg via INTRAVENOUS

## 2017-09-02 MED ORDER — FENTANYL CITRATE (PF) 100 MCG/2ML IJ SOLN
INTRAMUSCULAR | Status: AC
  Filled 2017-09-02: qty 2

## 2017-09-02 MED ORDER — OXYCODONE HCL 5 MG PO TABS: 5.0000 mg | ORAL_TABLET | Freq: Once | ORAL | Status: DC | PRN

## 2017-09-02 MED ORDER — FENTANYL CITRATE (PF) 100 MCG/2ML IJ SOLN
INTRAMUSCULAR | Status: DC | PRN
  Administered 2017-09-02: 100 ug via INTRAVENOUS

## 2017-09-02 MED ORDER — SODIUM CHLORIDE 0.9 % IV BOLUS
1000.0000 mL | Freq: Once | INTRAVENOUS | Status: AC
Start: 2017-09-02 — End: 2017-09-02
  Administered 2017-09-02: 1000 mL via INTRAVENOUS

## 2017-09-02 MED ORDER — PROMETHAZINE HCL 25 MG/ML IJ SOLN: 6.2500 mg | INTRAMUSCULAR | Status: DC | PRN

## 2017-09-02 MED ORDER — HYDROMORPHONE HCL 1 MG/ML IJ SOLN
1.0000 mg | Freq: Once | INTRAMUSCULAR | Status: AC
  Administered 2017-09-02: 1 mg via INTRAVENOUS
  Filled 2017-09-02: qty 1

## 2017-09-02 MED ORDER — PHENYLEPHRINE HCL 10 MG/ML IJ SOLN
INTRAMUSCULAR | Status: DC | PRN
  Administered 2017-09-02 (×2): 80 ug via INTRAVENOUS

## 2017-09-02 MED ORDER — DEXAMETHASONE SODIUM PHOSPHATE 10 MG/ML IJ SOLN
INTRAMUSCULAR | Status: DC | PRN
  Administered 2017-09-02: 5 mg via INTRAVENOUS

## 2017-09-02 MED ORDER — PHENYLEPHRINE 40 MCG/ML (10ML) SYRINGE FOR IV PUSH (FOR BLOOD PRESSURE SUPPORT)
PREFILLED_SYRINGE | INTRAVENOUS | Status: AC
  Filled 2017-09-02: qty 10

## 2017-09-02 MED ORDER — LIDOCAINE 2% (20 MG/ML) 5 ML SYRINGE
INTRAMUSCULAR | Status: DC | PRN
  Administered 2017-09-02: 60 mg via INTRAVENOUS

## 2017-09-02 MED ORDER — PROMETHAZINE HCL 25 MG/ML IJ SOLN
12.5000 mg | Freq: Once | INTRAMUSCULAR | Status: AC
  Administered 2017-09-02: 25 mg via INTRAVENOUS
  Filled 2017-09-02: qty 1

## 2017-09-02 MED ORDER — ONDANSETRON HCL 4 MG/2ML IJ SOLN
INTRAMUSCULAR | Status: DC | PRN
  Administered 2017-09-02: 4 mg via INTRAVENOUS

## 2017-09-02 MED ORDER — HYDROMORPHONE HCL 1 MG/ML IJ SOLN: 0.2500 mg | INTRAMUSCULAR | Status: DC | PRN

## 2017-09-02 MED ORDER — CIPROFLOXACIN IN D5W 400 MG/200ML IV SOLN
INTRAVENOUS | Status: AC
  Filled 2017-09-02: qty 200

## 2017-09-02 SURGICAL SUPPLY — 24 items
BAG URO CATCHER STRL LF (MISCELLANEOUS) ×2 IMPLANT
BASKET LASER NITINOL 1.9FR (BASKET) ×2 IMPLANT
BASKET ZERO TIP NITINOL 2.4FR (BASKET) IMPLANT
CATH INTERMIT  6FR 70CM (CATHETERS) IMPLANT
CLOTH BEACON ORANGE TIMEOUT ST (SAFETY) ×2 IMPLANT
COVER FOOTSWITCH UNIV (MISCELLANEOUS) IMPLANT
COVER SURGICAL LIGHT HANDLE (MISCELLANEOUS) ×2 IMPLANT
EXTRACTOR STONE NITINOL NGAGE (UROLOGICAL SUPPLIES) ×2 IMPLANT
FIBER LASER FLEXIVA 365 (UROLOGICAL SUPPLIES) IMPLANT
FIBER LASER TRAC TIP (UROLOGICAL SUPPLIES) IMPLANT
GLOVE BIOGEL M 8.0 STRL (GLOVE) ×2 IMPLANT
GOWN STRL REUS W/ TWL XL LVL3 (GOWN DISPOSABLE) ×1 IMPLANT
GOWN STRL REUS W/TWL LRG LVL3 (GOWN DISPOSABLE) ×4 IMPLANT
GOWN STRL REUS W/TWL XL LVL3 (GOWN DISPOSABLE) ×1
GUIDEWIRE ANG ZIPWIRE 038X150 (WIRE) ×2 IMPLANT
GUIDEWIRE STR DUAL SENSOR (WIRE) ×2 IMPLANT
IV NS 1000ML (IV SOLUTION) ×1
IV NS 1000ML BAXH (IV SOLUTION) ×1 IMPLANT
MANIFOLD NEPTUNE II (INSTRUMENTS) ×2 IMPLANT
PACK CYSTO (CUSTOM PROCEDURE TRAY) ×2 IMPLANT
SHEATH URETERAL 12FRX28CM (UROLOGICAL SUPPLIES) ×2 IMPLANT
STENT URET 6FRX24 CONTOUR (STENTS) ×2 IMPLANT
TUBING CONNECTING 10 (TUBING) ×2 IMPLANT
TUBING UROLOGY SET (TUBING) ×2 IMPLANT

## 2017-09-02 NOTE — Discharge Instructions (Signed)
1. You may see some blood in the urine and may have some burning with urination for 48-72 hours. You also may notice that you have to urinate more frequently or urgently after your procedure which is normal.  2. You should call should you develop an inability urinate, fever > 101, persistent nausea and vomiting that prevents you from eating or drinking to stay hydrated.  3. If you have a stent, you will likely urinate more frequently and urgently until the stent is removed and you may experience some discomfort/pain in the lower abdomen and flank especially when urinating. You may take pain medication prescribed to you if needed for pain. You may also intermittently have blood in the urine until the stent is removed.  It is okay to pull the thread to remove your stent on Tuesday morning.  Realize that there may be some pain after the stent is removed 4. If you have a catheter, you will be taught how to take care of the catheter by the nursing staff prior to discharge from the hospital.  You may periodically feel a strong urge to void with the catheter in place.  This is a bladder spasm and most often can occur when having a bowel movement or moving around. It is typically self-limited and usually will stop after a few minutes.  You may use some Vaseline or Neosporin around the tip of the catheter to reduce friction at the tip of the penis. You may also see some blood in the urine.  A very small amount of blood can make the urine look quite red.  As long as the catheter is draining well, there usually is not a problem.  However, if the catheter is not draining well and is bloody, you should call the office [phone]) to notify us. .Marland Kitchen

## 2017-09-02 NOTE — ED Notes (Signed)
WARM COMPRESS GIVEN FOR PAIN COMFORT

## 2017-09-02 NOTE — Anesthesia Preprocedure Evaluation (Addendum)
Anesthesia Evaluation  Patient identified by MRN, date of birth, ID band Patient awake    Reviewed: Allergy & Precautions, NPO status , Patient's Chart, lab work & pertinent test results  Airway Mallampati: II  TM Distance: >3 FB Neck ROM: Full    Dental  (+) Teeth Intact, Dental Advisory Given   Pulmonary asthma ,    breath sounds clear to auscultation       Cardiovascular negative cardio ROS   Rhythm:Regular Rate:Normal     Neuro/Psych  Headaches, PSYCHIATRIC DISORDERS Depression    GI/Hepatic Neg liver ROS, GERD  ,  Endo/Other  negative endocrine ROS  Renal/GU Renal disease     Musculoskeletal negative musculoskeletal ROS (+)   Abdominal Normal abdominal exam  (+)   Peds  Hematology negative hematology ROS (+)   Anesthesia Other Findings   Reproductive/Obstetrics                            Lab Results  Component Value Date   WBC 8.7 09/02/2017   HGB 11.3 (L) 09/02/2017   HCT 32.9 (L) 09/02/2017   MCV 88.0 09/02/2017   PLT 227 09/02/2017   Lab Results  Component Value Date   CREATININE 1.30 (H) 09/02/2017   BUN 22 (H) 09/02/2017   NA 141 09/02/2017   K 4.3 09/02/2017   CL 109 09/02/2017   CO2 26 09/02/2017   No results found for: INR, PROTIME  Anesthesia Physical Anesthesia Plan  ASA: II  Anesthesia Plan: General   Post-op Pain Management:    Induction: Intravenous, Rapid sequence and Cricoid pressure planned  PONV Risk Score and Plan: 4 or greater and Ondansetron, Dexamethasone, Midazolam, Scopolamine patch - Pre-op and Treatment may vary due to age or medical condition  Airway Management Planned: Oral ETT  Additional Equipment: None  Intra-op Plan:   Post-operative Plan: Extubation in OR  Informed Consent: I have reviewed the patients History and Physical, chart, labs and discussed the procedure including the risks, benefits and alternatives for the proposed  anesthesia with the patient or authorized representative who has indicated his/her understanding and acceptance.   Dental advisory given  Plan Discussed with: CRNA  Anesthesia Plan Comments:        Anesthesia Quick Evaluation

## 2017-09-02 NOTE — Anesthesia Postprocedure Evaluation (Signed)
Anesthesia Post Note  Patient: Monica Williams  Procedure(s) Performed: CYSTOSCOPY/URETEROSCOPY/RETROGRADE /STENT PLACEMENT (Right )     Patient location during evaluation: PACU Anesthesia Type: General Level of consciousness: awake and alert Pain management: pain level controlled Vital Signs Assessment: post-procedure vital signs reviewed and stable Respiratory status: spontaneous breathing, nonlabored ventilation, respiratory function stable and patient connected to nasal cannula oxygen Cardiovascular status: blood pressure returned to baseline and stable Postop Assessment: no apparent nausea or vomiting Anesthetic complications: no    Last Vitals:  Vitals:   09/02/17 2200 09/02/17 2223  BP: 120/78 (!) 151/95  Pulse: 92 96  Resp: 16   Temp:  37.1 C  SpO2: 94% 94%    Last Pain:  Vitals:   09/02/17 2145  TempSrc:   PainSc: Asleep                 Shelton SilvasKevin D Laryssa Hassing

## 2017-09-02 NOTE — H&P (Addendum)
H&P   Chief complaint: Right-sided kidney stone  Consulting MD: Ardeen JourdainM Belfi, MD  History of Present Illness: I presented for consultation regarding the patient Monica Williams is a 42 y.o. year old female with a prior history of urolithiasis, status post multiple ureteroscopic and shockwave lithotripsy treatments, presenting for the second time to the emergency room in 4 days with right flank pain secondary to a 3 mm right distal ureteral stone.  There are no complicating factors i.e. infection, significant nausea or vomiting currently.  She is n.p.o.  Initial CT scan was done 4 days ago, follow-up ultrasound and KUB today revealed persistent hydronephrosis with probable persistent 3 mm right distal ureteral stone.  As the patient has significant pain, recurrent presentations, she presents at this time for definitive ureteroscopic management of this right ureteral stone as well as probable management of her right renal calculi as well.  Past Medical History:  Diagnosis Date  . Asthma    last attack 05/2015  . Depression   . Family history of adverse reaction to anesthesia    mother has nausea and difficulty waking up  . Frequent headaches   . Frequent UTI   . Headache    migraines  . History of chickenpox   . History of colon polyps   . History of kidney stones   . Kidney calculus    multiple stone HX  . Renal disorder     Past Surgical History:  Procedure Laterality Date  . ABLATION    . CYSTOSCOPY W/ URETEROSCOPY    . CYSTOSCOPY WITH RETROGRADE PYELOGRAM, URETEROSCOPY AND STENT PLACEMENT  04/09/2012   Procedure: CYSTOSCOPY WITH RETROGRADE PYELOGRAM, URETEROSCOPY AND STENT PLACEMENT;  Surgeon: Marcine MatarStephen Aemon Koeller, MD;  [facility] [place];  Service: Urology;  Laterality: Right;  . CYSTOSCOPY/URETEROSCOPY/HOLMIUM LASER/STENT PLACEMENT Right 02/01/2016   Procedure: CYSTOSCOPY/RIGHT URETEROSCOPY/ HOLMIUM LASER/ RIGHT STENT PLACEMENT;  Surgeon: Bjorn PippinJohn Wrenn, MD;  Location: [facility];   Service: Urology;  Laterality: Right;  . HOLMIUM LASER APPLICATION  04/09/2012   Procedure: HOLMIUM LASER APPLICATION;  Surgeon: Marcine MatarStephen Juliano Mceachin, MD;  [facility] [place];  Service: Urology;  Laterality: Right;  . LITHOTRIPSY  2004  . TUBAL LIGATION  2005    Home Medications:   (Not in a hospital admission)  Allergies:  Allergies  Allergen Reactions  . Penicillins Hives    Has patient had a PCN reaction causing immediate rash, facial/tongue/throat swelling, SOB or lightheadedness with hypotension: No Has patient had a PCN reaction causing severe rash involving mucus membranes or skin necrosis: No Has patient had a PCN reaction that required hospitalization No Has patient had a PCN reaction occurring within the last 10 years: No If all of the above answers are "NO", then may proceed with Cephalosporin use.   . Sulfa Antibiotics Hives    Family History  Problem Relation Age of Onset  . Alcohol abuse Mother   . Arthritis Mother   . Asthma Mother   . Depression Mother   . Hypertension Mother   . Alcohol abuse Father   . Asthma Father   . Hypertension Father   . Learning disabilities Father   . Miscarriages / Stillbirths Sister   . Depression Brother   . Early death Brother   . Learning disabilities Brother   . Mental illness Brother   . Bipolar disorder Brother   . Suicidality Brother   . Asthma Daughter   . Depression Son   . Learning disabilities Son   .  Mental illness Son   . Arthritis Maternal Grandmother   . Heart disease Maternal Grandmother   . Hypertension Maternal Grandmother   . Mental illness Maternal Grandfather   . Cancer Paternal Grandmother        breast cancer  . Hypertension Paternal Grandmother   . Hypertension Paternal Grandfather   . Asthma Brother     Social History:  reports that she has never smoked. She has never used smokeless tobacco. She reports that she drinks alcohol. She reports that she does not use  drugs.  ROS: A complete review of systems was performed.  All systems are negative except for pertinent findings as noted.  Physical Exam:  Vital signs in last 24 hours: Temp:  [98.2 F (36.8 C)-99.7 F (37.6 C)] 99.7 F (37.6 C) (06/29 1325) Pulse Rate:  [73-100] 73 (06/29 1730) Resp:  [16-18] 16 (06/29 1730) BP: (119-143)/(79-94) 131/83 (06/29 1730) SpO2:  [95 %-100 %] 95 % (06/29 1730) Weight:  [99.8 kg (220 lb)] 99.8 kg (220 lb) (06/29 1156) General:  Alert and oriented, No acute distress HEENT: Normocephalic, atraumatic Neck: No JVD  Cardiovascular: Regular rate  Lungs: Normal inspiratory and expiratory excursion Abdomen: Soft, nontender, nondistended, no abdominal masses Back:  Right CVA tenderness Extremities: No edema Neurologic: Grossly intact  Laboratory Data:  Results for orders placed or performed during the hospital encounter of 09/02/17 (from the past 24 hour(s))  Urinalysis, Routine w reflex microscopic- may I&O cath if menses     Status: Abnormal   Collection Time: 09/02/17 11:55 AM  Result Value Ref Range   Color, Urine YELLOW YELLOW   APPearance CLEAR CLEAR   Specific Gravity, Urine 1.017 1.005 - 1.030   pH 6.0 5.0 - 8.0   Glucose, UA NEGATIVE NEGATIVE mg/dL   Hgb urine dipstick MODERATE (A) NEGATIVE   Bilirubin Urine NEGATIVE NEGATIVE   Ketones, ur NEGATIVE NEGATIVE mg/dL   Protein, ur NEGATIVE NEGATIVE mg/dL   Nitrite NEGATIVE NEGATIVE   Leukocytes, UA NEGATIVE NEGATIVE   RBC / HPF 6-10 0 - 5 RBC/hpf   WBC, UA 0-5 0 - 5 WBC/hpf   Bacteria, UA NONE SEEN NONE SEEN   Squamous Epithelial / LPF 0-5 0 - 5   Mucus PRESENT   Basic metabolic panel     Status: Abnormal   Collection Time: 09/02/17  1:21 PM  Result Value Ref Range   Sodium 141 135 - 145 mmol/L   Potassium 4.3 3.5 - 5.1 mmol/L   Chloride 109 98 - 111 mmol/L   CO2 26 22 - 32 mmol/L   Glucose, Bld 107 (H) 70 - 99 mg/dL   BUN 22 (H) 6 - 20 mg/dL   Creatinine, Ser 9.60 (H) 0.44 - 1.00 mg/dL    Calcium 9.0 8.9 - 45.4 mg/dL   GFR calc non Af Amer 49 (L) >60 mL/min   GFR calc Af Amer 57 (L) >60 mL/min   Anion gap 6 5 - 15  CBC with Differential     Status: Abnormal   Collection Time: 09/02/17  1:21 PM  Result Value Ref Range   WBC 8.7 4.0 - 10.5 K/uL   RBC 3.74 (L) 3.87 - 5.11 MIL/uL   Hemoglobin 11.3 (L) 12.0 - 15.0 g/dL   HCT 09.8 (L) 11.9 - 14.7 %   MCV 88.0 78.0 - 100.0 fL   MCH 30.2 26.0 - 34.0 pg   MCHC 34.3 30.0 - 36.0 g/dL   RDW 82.9 56.2 - 13.0 %  Platelets 227 150 - 400 K/uL   Neutrophils Relative % 79 %   Neutro Abs 6.9 1.7 - 7.7 K/uL   Lymphocytes Relative 12 %   Lymphs Abs 1.0 0.7 - 4.0 K/uL   Monocytes Relative 8 %   Monocytes Absolute 0.7 0.1 - 1.0 K/uL   Eosinophils Relative 1 %   Eosinophils Absolute 0.1 0.0 - 0.7 K/uL   Basophils Relative 0 %   Basophils Absolute 0.0 0.0 - 0.1 K/uL   No results found for this or any previous visit (from the past 240 hour(s)). Creatinine: Recent Labs    08/29/17 2351 09/02/17 1321  CREATININE 0.99 1.30*    Radiologic Imaging: Dg Abdomen 1 View  Result Date: 09/02/2017 CLINICAL DATA:  History of kidney stones.  Right flank pain. EXAM: ABDOMEN - 1 VIEW COMPARISON:  None. FINDINGS: The bowel gas pattern is normal.  Bilateral nephrolithiasis. Calcific density in the right pelvis likely corresponds to the distal right ureteral stone demonstrated better by CT dated 08/30/2017. IMPRESSION: Bilateral nephrolithiasis. Right distal ureteral calculus. Electronically Signed   By: Ted Mcalpine M.D.   On: 09/02/2017 18:52   US Renal  Result Date: 09/02/2017 CLINICAL DATA:  Right flank pain. EXAM: RENAL / URINARY TRACT ULTRASOUND COMPLETE COMPARISON:  Body CT 08/30/2017 FINDINGS: Right Kidney: Length: 13.4 cm. Marked right hydronephrosis and hydroureter. The proximal ureter at the UPJ measures 3 cm in cross-section. Nonobstructive calculi in the lower pole of the right kidney measuring 8 mm. Left Kidney: Length: 11.5  cm. Echogenicity within normal limits. No mass or hydronephrosis visualized. Multiple nonobstructive calculi throughout the left kidney, the largest measuring 8 mm. Bladder: Appears normal for degree of bladder distention. IMPRESSION: Marked right hydroureter and hydronephrosis. Bilateral nephrolithiasis. Electronically Signed   By: Ted Mcalpine M.D.   On: 09/02/2017 16:09    Impression/Assessment:  Right distal ureteral stone and right renal calculi with recurrent pain episodes  Plan:  Cystoscopy, right retrograde, right ureteroscopy, holmium laser extraction of right renal stone, extraction of all ureteral and renal stones, double-J stent placement --the procedure was discussed with the patient who is a veteran of multiple procedures.  She understands these and desires to proceed.  Monica Williams 09/02/2017, 7:53 PM  Monica Millard. Jalee Saine MD  Cc: Dr Fredderick Phenix

## 2017-09-02 NOTE — ED Provider Notes (Signed)
[facility]-EMERGENCY DEPT Provider Note   CSN: [id number] Arrival date & time: 09/02/17  1147     History   Chief Complaint Chief Complaint  Patient presents with  . Flank Pain    HPI Monica Williams is a 42 y.o. female with a history of kidney stones who was required prior to lithotripsy and prior stent placement who presents emergency department today for right-sided flank pain.  Patient was seen here on 08/29/2017 for right-sided flank pain that she felt similar prior kidney stones.  Patient underwent a CT scan revealed a 3 mm obstructing distal right ureteral stone with mild hydronephrosis.  There is no evidence of UTI at that time and patient is without any acute kidney injury.  Patient reports she was discharged home in good condition with a prescription for Norco.  She states she has been taking that medication as prescribed however for the last 24 hours her pain has been increasing in her right flank/lower back.  She has developed nausea as well as several episodes of nonbilious, nonbloody emesis.  She reports she does not have any antinausea medication at home.  She states her current pain level is a 9/10 and describes as throbbing.  She notes that nothing makes this better or worse.  In addition she has tried warm compresses as well as lemon tea/water without relief. She denies any fever, chills, chest pain, shortness of breath, abdominal pain, urinary frequency, urinary urgency, dysuria, hematuria.  No abdominal trauma.  Patient still passing gas.  Normal bowel movement today.  HPI  Past Medical History:  Diagnosis Date  . Asthma    last attack 05/2015  . Depression   . Family history of adverse reaction to anesthesia    mother has nausea and difficulty waking up  . Frequent headaches   . Frequent UTI   . Headache    migraines  . History of chickenpox   . History of colon polyps   . History of kidney stones   . Kidney calculus    multiple stone HX  .  Renal disorder     Patient Active Problem List   Diagnosis Date Noted  . Vaginal bleeding, abnormal 05/08/2017  . Hyperglycemia 05/08/2017  . Depression, recurrent (HCC) 05/08/2017  . GERD (gastroesophageal reflux disease) 04/07/2017  . Kidney stone 09/05/2015  . Renal colic on right side 09/05/2015    Past Surgical History:  Procedure Laterality Date  . ABLATION    . CYSTOSCOPY W/ URETEROSCOPY    . CYSTOSCOPY WITH RETROGRADE PYELOGRAM, URETEROSCOPY AND STENT PLACEMENT  04/09/2012   Procedure: CYSTOSCOPY WITH RETROGRADE PYELOGRAM, URETEROSCOPY AND STENT PLACEMENT;  Surgeon: Marcine Matar, MD;  Location: [facility];  Service: Urology;  Laterality: Right;  . CYSTOSCOPY/URETEROSCOPY/HOLMIUM LASER/STENT PLACEMENT Right 02/01/2016   Procedure: CYSTOSCOPY/RIGHT URETEROSCOPY/ HOLMIUM LASER/ RIGHT STENT PLACEMENT;  Surgeon: Bjorn Pippin, MD;  Location: [facility];  Service: Urology;  Laterality: Right;  . HOLMIUM LASER APPLICATION  04/09/2012   Procedure: HOLMIUM LASER APPLICATION;  Surgeon: Marcine Matar, MD;  Location[facility];  Service: Urology;  Laterality: Right;  . LITHOTRIPSY  2004  . TUBAL LIGATION  2005     OB History   None      Home Medications    Prior to Admission medications   Medication Sig Start Date End Date Taking? Authorizing Provider  albuterol (PROAIR HFA) 108 (90 Base) MCG/ACT inhaler Inhale two puffs every four to six hours as needed for cough or  wheeze. 06/15/17   Kozlow, Alvira PhilipsEric J, MD  Aspirin-Acetaminophen-Caffeine (EXCEDRIN MIGRAINE PO) Take 1 tablet by mouth as needed (migraine).     [provider]  DULoxetine (CYMBALTA) 30 MG capsule Take 1 capsule (30 mg total) by mouth daily. 05/05/17   Nche, Bonna Gainsharlotte Lum, NP  HYDROcodone-acetaminophen (NORCO/VICODIN) 5-325 MG tablet Take 1-2 tablets by mouth every 6 (six) hours as needed. 08/30/17   Roxy HorsemanBrowning, Robert, PA-C  Ibuprofen (ADVIL PO) Take 200 mg by mouth as needed (pain).      [provider]  levocetirizine (XYZAL) 5 MG tablet Take 5 mg by mouth every evening.    [provider]  montelukast (SINGULAIR) 10 MG tablet Take 1 tablet (10 mg total) by mouth at bedtime. 06/15/17   Kozlow, Alvira PhilipsEric J, MD  Olopatadine HCl 0.2 % SOLN Can use one drop in each eye once daily as needed for itchy eyes. Patient not taking: Reported on 08/30/2017 06/15/17   Jessica PriestKozlow, Eric J, MD  phenazopyridine (PYRIDIUM) 95 MG tablet Take 95 mg by mouth 3 (three) times daily as needed for pain.    [provider]  ranitidine (ZANTAC) 150 MG tablet Take 1 tablet (150 mg total) by mouth 2 (two) times daily. 05/05/17   Nche, Bonna Gainsharlotte Lum, NP    Family History Family History  Problem Relation Age of Onset  . Alcohol abuse Mother   . Arthritis Mother   . Asthma Mother   . Depression Mother   . Hypertension Mother   . Alcohol abuse Father   . Asthma Father   . Hypertension Father   . Learning disabilities Father   . Miscarriages / Stillbirths Sister   . Depression Brother   . Early death Brother   . Learning disabilities Brother   . Mental illness Brother   . Bipolar disorder Brother   . Suicidality Brother   . Asthma Daughter   . Depression Son   . Learning disabilities Son   . Mental illness Son   . Arthritis Maternal Grandmother   . Heart disease Maternal Grandmother   . Hypertension Maternal Grandmother   . Mental illness Maternal Grandfather   . Cancer Paternal Grandmother        breast cancer  . Hypertension Paternal Grandmother   . Hypertension Paternal Grandfather   . Asthma Brother     Social History Social History   Tobacco Use  . Smoking status: Never Smoker  . Smokeless tobacco: Never Used  Substance Use Topics  . Alcohol use: Yes    Comment: social  . Drug use: No     Allergies   Penicillins and Sulfa antibiotics   Review of Systems Review of Systems  All other systems reviewed and are negative.    Physical Exam Updated Vital  Signs BP (!) 143/94 (BP Location: Left Arm)   Pulse 100   Temp 98.2 F (36.8 C) (Oral)   Resp 18   Ht 5' 7.75" (1.721 m)   Wt 99.8 kg (220 lb)   SpO2 100%   BMI 33.70 kg/m   Physical Exam  Constitutional: She appears well-developed and well-nourished.  Uncomfortable appearing  HENT:  Head: Normocephalic and atraumatic.  Right Ear: External ear normal.  Left Ear: External ear normal.  Nose: Nose normal.  Mouth/Throat: Uvula is midline, oropharynx is clear and moist and mucous membranes are normal. No tonsillar exudate.  Eyes: Pupils are equal, round, and reactive to light. Right eye exhibits no discharge. Left eye exhibits no discharge. No scleral  icterus.  Neck: Trachea normal. Neck supple. No spinous process tenderness present. No neck rigidity. Normal range of motion present.  Cardiovascular: Normal rate, regular rhythm and intact distal pulses.  No murmur heard. Pulses:      Radial pulses are 2+ on the right side, and 2+ on the left side.       Dorsalis pedis pulses are 2+ on the right side, and 2+ on the left side.       Posterior tibial pulses are 2+ on the right side, and 2+ on the left side.  No lower extremity swelling or edema. Calves symmetric in size bilaterally.  Pulmonary/Chest: Effort normal and breath sounds normal. She exhibits no tenderness.  Abdominal: Soft. Bowel sounds are normal. She exhibits no distension. There is no tenderness. There is CVA tenderness (right). There is no rigidity, no rebound and no guarding.  Musculoskeletal: She exhibits no edema.  Lymphadenopathy:    She has no cervical adenopathy.  Neurological: She is alert.  Skin: Skin is warm and dry. No rash noted. She is not diaphoretic.  Psychiatric: She has a normal mood and affect.  Nursing note and vitals reviewed.    ED Treatments / Results  Labs (all labs ordered are listed, but only abnormal results are displayed) Labs Reviewed  URINALYSIS, ROUTINE W REFLEX MICROSCOPIC - Abnormal;  Notable for the following components:      Result Value   Hgb urine dipstick MODERATE (*)    All other components within normal limits  BASIC METABOLIC PANEL - Abnormal; Notable for the following components:   Glucose, Bld 107 (*)    BUN 22 (*)    Creatinine, Ser 1.30 (*)    GFR calc non Af Amer 49 (*)    GFR calc Af Amer 57 (*)    All other components within normal limits  CBC WITH DIFFERENTIAL/PLATELET - Abnormal; Notable for the following components:   RBC 3.74 (*)    Hemoglobin 11.3 (*)    HCT 32.9 (*)    All other components within normal limits    EKG None  Radiology No results found.  Procedures Procedures (including critical care time)  Medications Ordered in ED Medications  HYDROmorphone (DILAUDID) injection 0.5 mg (has no administration in time range)  HYDROmorphone (DILAUDID) injection 1 mg (1 mg Intravenous Given 09/02/17 1315)  sodium chloride 0.9 % bolus 1,000 mL (1,000 mLs Intravenous New Bag/Given 09/02/17 1315)  promethazine (PHENERGAN) injection 12.5-25 mg (25 mg Intravenous Given 09/02/17 1328)  diphenhydrAMINE (BENADRYL) capsule 25 mg (25 mg Oral Given 09/02/17 1315)     Initial Impression / Assessment and Plan / ED Course  I have reviewed the triage vital signs and the nursing notes.  Pertinent labs & imaging results that were available during my care of the patient were reviewed by me and considered in my medical decision making (see chart for details).     42 year old female with a history of kidney stones that is required prior lithotripsy as well as stent placement presenting with right-sided flank pain.  Patient was seen here on 6/25 and had a CT scan that showed a 3 mm obstructing distal right ureteral stone with mild hydronephrosis.  She was discharged home with Vicodin.  Patient is presenting as pain medication is not controlling pain and she now has nausea and vomiting.  Patient's vital signs are reassuring on presentation.  Patient is without  fever however is tachycardic at 100.  There is no hypotension, hypoxia or tachypnea.  Patient's  urinalysis without evidence of UTI.  No leukocytosis.  Patient's BMP is significant for elevation of creatinine from 0.99 on the 25th to now 1.3.  BUN is stable at 22 from prior.  Patient is without any anion gap acidosis.  Electrolytes within normal limits.  With renal ultrasound pending case signed out to Langston Masker, [place].  Plan to follow-up with ultrasound and correlate with creatinine.  There is concern with elevation of creatinine to now 1.3.  Pain is currently controlled.  Final Clinical Impressions(s) / ED Diagnoses   Final diagnoses:  None    ED Discharge Orders    None       Princella Pellegrini 09/02/17 1551    Terrilee Files, MD 09/02/17 1859

## 2017-09-02 NOTE — Op Note (Signed)
Preoperative diagnosis: 3 mm right distal ureteral stone, 7 mm right lower pole stone with significant hydronephrosis  Postoperative diagnosis: No evidence of right distal ureteral stone, significant right hydronephrosis from distal ureteral stricture, no large right renal stone, 1 mm right renal stone  Principal procedure: Cystoscopy, right retrograde ureteropyelogram, fluoroscopic interpretation, right ureteroscopy, extraction of small right renal stone, placement of 6 French by 24 cm contour double-J stent with tether  Surgeon: Braylei Totino  Anesthesia: General with LMA  Complications: None  Specimen: One tiny stone  Estimated blood loss: None  Indications: 42 year old female with recurrent urolithiasis now presenting with her second emergency room visit for right flank pain.  Recent CT scan revealed 3 mm right distal ureteral stone with significant proximal hydroureteronephrosis.  She presented to the emergency room today where KUB revealed a probable persistent stone as well as a larger lower pole stone which was also documented on CT scan.  She presents at this time for ureteroscopic management of this right ureteral stone, and at the same time extraction of any right renal calculi.  The procedure as well as risks and complications have been discussed with the patient who understands these and desires to proceed.  Findings: Bladder appeared normal.  Ureteral orifices were normal in configuration location.  Retrograde ureteropyelogram was performed revealing normal distal ureter but several centimeters up there was significant change in caliber of the ureter with significant hydroureteronephrosis up to the renal pelvis.  Pyelocalyceal system was significantly dilated as well.  No significant filling defects were seen.  Description of procedure: The patient was properly identified and marked in the holding area.  She was taken to the operating room where general anesthetic was administered  with the LMA.  She was administered 400 mg of Cipro IV.  She was placed in the dorsolithotomy position with genitalia and perineum prepped.  She was then draped.  Proper timeout was performed.  A 22 French panendoscope was advanced into her bladder.  Bladder was inspected circumferentially with the above-mentioned findings, and ureteral pyelogram performed with a 6 French open-ended catheter and Omnipaque.  Following this, the right ureter was dilated first with the obturator and then the entire 12/14 ureteral access catheter.  This was quite easily done.  The ureteroscope was advanced once the ureteral access catheter was removed.  The distal ureter was normal.  There was a sudden increase in ureteral diameter consistent with a ureteral stricture and proximal hydro-.  I did not see a small 3 mm stone either within the normal caliber ureter or in the dilated ureter, with the inspection carried out all the way to the renal pelvis.  I actually moved the scope up and back twice to find a stone, however no stone was encountered within the ureter.  It was felt that she had may have passed this, or it could be extra uretera  I then remove the rigid ureteroscope, and placed the ureteral access catheter.  I then passed the flexible ureteroscope all the way into the renal pelvis.  Pyelocalyceal system was significantly dilated.  There were multiple Randall's plaques noted.  There was one small 1 mm stone within it interpolar calyx.  However, it was felt that all the stone seen on CT scan and KUB were intraparenchymal/perhaps an calyceal diverticulae.  After thorough inspection and there being no further stones (a small stone was grasped and extracted), the scope was removed.  Guidewire was placed through the ureteral access catheter with the access catheter removed.  Guidewire  was backloaded through the cystoscope and over top of the guidewire a 24 cm x 6 [place] contour double-J stent was passed with the thread left on.   The bladder was drained.  The scope was removed.  Excellent positioning was seen fluoroscopically and cystoscopically prior to removal of the scope.  The string was knotted just outside the vagina, trimmed, and then tucked within the vagina.  At this point, the procedure was terminated.  The patient was then awakened and taken to the PACU in stable condition.  She tolerated the procedure well.

## 2017-09-02 NOTE — ED Triage Notes (Signed)
Pt presents with c/o right flank pain. Pt has a diagnosed kidney stone from earlier this week but she reports that the pain is now very severe and in her right flank. Pt has a hx of narrowing in the ureter.

## 2017-09-02 NOTE — ED Provider Notes (Signed)
Pt's care assumed at 4pm.   Ultrasound pending.  Ultrasound shows hydronephrosis Pt feels better after IV medications. Pt reports being pain free   I spoke to Dr. Retta Dionesahlstedt.   He requested KUB.  I spoke to Dr. Retta Dionesahlstedt and again and he will see pt tonight for treatment.   Elson AreasSofia, Leslie K, [place]-C 09/02/17 1934    Rolan BuccoBelfi, Melanie, MD 09/02/17 2014

## 2017-09-02 NOTE — Transfer of Care (Signed)
Immediate Anesthesia Transfer of Care Note  Patient: Monica Williams  Procedure(s) Performed: CYSTOSCOPY/URETEROSCOPY/RETROGRADE /STENT PLACEMENT (Right )  Patient Location: PACU  Anesthesia Type:General  Level of Consciousness: sedated, patient cooperative and responds to stimulation  Airway & Oxygen Therapy: Patient Spontanous Breathing and Patient connected to face mask oxygen  Post-op Assessment: Report given to RN and Post -op Vital signs reviewed and stable  Post vital signs: Reviewed and stable  Last Vitals:  Vitals Value Taken Time  BP 114/77 09/02/2017  9:35 PM  Temp    Pulse 95 09/02/2017  9:37 PM  Resp 14 09/02/2017  9:37 PM  SpO2 91 % 09/02/2017  9:37 PM  Vitals shown include unvalidated device data.  Last Pain:  Vitals:   09/02/17 1930  TempSrc:   PainSc: 8          Complications: No apparent anesthesia complications

## 2017-09-02 NOTE — Anesthesia Procedure Notes (Signed)
Procedure Name: Intubation Performed by: Gean Maidens, CRNA Pre-anesthesia Checklist: Patient identified, Emergency Drugs available, Suction available, Patient being monitored and Timeout performed Patient Re-evaluated:Patient Re-evaluated prior to induction Oxygen Delivery Method: Circle system utilized Preoxygenation: Pre-oxygenation with 100% oxygen Induction Type: IV induction and Rapid sequence Laryngoscope Size: Mac Grade View: Grade I Tube type: Oral Tube size: 7.0 mm Number of attempts: 1 Airway Equipment and Method: Stylet Placement Confirmation: ETT inserted through vocal cords under direct vision,  positive ETCO2,  CO2 detector and breath sounds checked- equal and bilateral Secured at: 21 cm Tube secured with: Tape Dental Injury: Teeth and Oropharynx as per pre-operative assessment

## 2017-09-03 ENCOUNTER — Encounter (HOSPITAL_COMMUNITY): Payer: Self-pay | Admitting: Urology

## 2017-09-15 ENCOUNTER — Telehealth: Payer: Self-pay | Admitting: Nurse Practitioner

## 2017-09-15 DIAGNOSIS — N200 Calculus of kidney: Secondary | ICD-10-CM

## 2017-09-15 NOTE — Telephone Encounter (Signed)
Referral placed.  Please help.

## 2017-09-15 NOTE — Telephone Encounter (Signed)
[name] please advise, ok for referral or you want to see her first? Last ov with you was 05/05/17.      Copied from CRM [id number]. Topic: Referral - Request >> Sep 15, 2017  1:02 PM Monica Williams, [name] wrote: Reason for CRM: Pt is calling wanting a referral to a urologist. She states she was in the ER on June 26 and on June 29 she had kidney stone removal on June 29  Callback # [id number]

## 2017-09-15 NOTE — Telephone Encounter (Signed)
I have the referral that was entered for [facility] for insurance purposes, due to patient having [facility] plan. Patient has an appointment for 09/18/17. Due to our fax not working in the office, I will fax the referral to [facility] as soon as fax is back up running.

## 2017-09-15 NOTE — Telephone Encounter (Signed)
She already has referral to [facility]. IF she has not been called with appt, she should call their office.

## 2017-09-18 ENCOUNTER — Encounter: Payer: Self-pay | Admitting: Nurse Practitioner

## 2017-09-25 ENCOUNTER — Encounter: Payer: Self-pay | Admitting: Nurse Practitioner

## 2017-09-27 ENCOUNTER — Encounter: Payer: Self-pay | Admitting: Nurse Practitioner

## 2017-11-01 ENCOUNTER — Encounter: Payer: Self-pay | Admitting: Nurse Practitioner

## 2017-12-29 ENCOUNTER — Encounter: Payer: Self-pay | Admitting: Nurse Practitioner

## 2017-12-29 ENCOUNTER — Ambulatory Visit (INDEPENDENT_AMBULATORY_CARE_PROVIDER_SITE_OTHER): Payer: No Typology Code available for payment source | Admitting: Nurse Practitioner

## 2017-12-29 VITALS — BP 122/76 | HR 86 | Temp 98.5°F | Ht 67.0 in | Wt 230.0 lb

## 2017-12-29 DIAGNOSIS — F339 Major depressive disorder, recurrent, unspecified: Secondary | ICD-10-CM | POA: Diagnosis not present

## 2017-12-29 DIAGNOSIS — R21 Rash and other nonspecific skin eruption: Secondary | ICD-10-CM

## 2017-12-29 DIAGNOSIS — E870 Hyperosmolality and hypernatremia: Secondary | ICD-10-CM | POA: Diagnosis not present

## 2017-12-29 DIAGNOSIS — D649 Anemia, unspecified: Secondary | ICD-10-CM

## 2017-12-29 LAB — BASIC METABOLIC PANEL
BUN: 19 mg/dL (ref 6–23)
CHLORIDE: 106 meq/L (ref 96–112)
CO2: 25 mEq/L (ref 19–32)
CREATININE: 0.81 mg/dL (ref 0.40–1.20)
Calcium: 9.4 mg/dL (ref 8.4–10.5)
GFR: 81.59 mL/min (ref >60.00)
Glucose, Bld: 95 mg/dL (ref 70–99)
POTASSIUM: 3.8 meq/L (ref 3.5–5.1)
SODIUM: 139 meq/L (ref 135–145)

## 2017-12-29 NOTE — Patient Instructions (Addendum)
Maintain adequate oral hydration (water)  Normal urine osmolarity and BMP. Pending ADH, ANA and IBC.  Sign medical release to get records from urology.  I will recommend use of task list to help organize with responsibilities at work. Start exercise regimen to help with increase anxiety. Recommend appt with psychology whom can schedule appts after 5pm.  DASH Eating Plan DASH stands for "Dietary Approaches to Stop Hypertension." The DASH eating plan is a healthy eating plan that has been shown to reduce high blood pressure (hypertension). It may also reduce your risk for type 2 diabetes, heart disease, and stroke. The DASH eating plan may also help with weight loss. What are tips for following this plan? General guidelines  Avoid eating more than 2,300 mg (milligrams) of salt (sodium) a day. If you have hypertension, you may need to reduce your sodium intake to 1,500 mg a day.  Limit alcohol intake to no more than 1 drink a day for nonpregnant women and 2 drinks a day for men. One drink equals 12 oz of beer, 5 oz of wine, or 1 oz of hard liquor.  Work with your health care provider to maintain a healthy body weight or to lose weight. Ask what an ideal weight is for you.  Get at least 30 minutes of exercise that causes your heart to beat faster (aerobic exercise) most days of the week. Activities may include walking, swimming, or biking.  Work with your health care provider or diet and nutrition specialist (dietitian) to adjust your eating plan to your individual calorie needs. Reading food labels  Check food labels for the amount of sodium per serving. Choose foods with less than 5 percent of the Daily Value of sodium. Generally, foods with less than 300 mg of sodium per serving fit into this eating plan.  To find whole grains, look for the word "whole" as the first word in the ingredient list. Shopping  Buy products labeled as "low-sodium" or "no salt added."  Buy fresh foods.  Avoid canned foods and premade or frozen meals. Cooking  Avoid adding salt when cooking. Use salt-free seasonings or herbs instead of table salt or sea salt. Check with your health care provider or pharmacist before using salt substitutes.  Do not fry foods. Cook foods using healthy methods such as baking, boiling, grilling, and broiling instead.  Cook with heart-healthy oils, such as olive, canola, soybean, or sunflower oil. Meal planning   Eat a balanced diet that includes: ? 5 or more servings of fruits and vegetables each day. At each meal, try to fill half of your plate with fruits and vegetables. ? Up to 6-8 servings of whole grains each day. ? Less than 6 oz of lean meat, poultry, or fish each day. A 3-oz serving of meat is about the same size as a deck of cards. One egg equals 1 oz. ? 2 servings of low-fat dairy each day. ? A serving of nuts, seeds, or beans 5 times each week. ? Heart-healthy fats. Healthy fats called Omega-3 fatty acids are found in foods such as flaxseeds and coldwater fish, like sardines, salmon, and mackerel.  Limit how much you eat of the following: ? Canned or prepackaged foods. ? Food that is high in trans fat, such as fried foods. ? Food that is high in saturated fat, such as fatty meat. ? Sweets, desserts, sugary drinks, and other foods with added sugar. ? Full-fat dairy products.  Do not salt foods before eating.  Try to  eat at least 2 vegetarian meals each week.  Eat more home-cooked food and less restaurant, buffet, and fast food.  When eating at a restaurant, ask that your food be prepared with less salt or no salt, if possible. What foods are recommended? The items listed may not be a complete list. Talk with your dietitian about what dietary choices are best for you. Grains Whole-grain or whole-wheat bread. Whole-grain or whole-wheat pasta. Brown rice. Orpah Cobbatmeal. Quinoa. Bulgur. Whole-grain and low-sodium cereals. Pita bread. Low-fat,  low-sodium crackers. Whole-wheat flour tortillas. Vegetables Fresh or frozen vegetables (raw, steamed, roasted, or grilled). Low-sodium or reduced-sodium tomato and vegetable juice. Low-sodium or reduced-sodium tomato sauce and tomato paste. Low-sodium or reduced-sodium canned vegetables. Fruits All fresh, dried, or frozen fruit. Canned fruit in natural juice (without added sugar). Meat and other protein foods Skinless chicken or Malawiturkey. Ground chicken or [place]urkey. Pork with fat trimmed off. Fish and seafood. Egg whites. Dried beans, peas, or lentils. Unsalted nuts, nut butters, and seeds. Unsalted canned beans. Lean cuts of beef with fat trimmed off. Low-sodium, lean deli meat. Dairy Low-fat (1%) or fat-free (skim) milk. Fat-free, low-fat, or reduced-fat cheeses. Nonfat, low-sodium ricotta or cottage cheese. Low-fat or nonfat yogurt. Low-fat, low-sodium cheese. Fats and oils Soft margarine without trans fats. Vegetable oil. Low-fat, reduced-fat, or light mayonnaise and salad dressings (reduced-sodium). Canola, safflower, olive, soybean, and sunflower oils. Avocado. Seasoning and other foods Herbs. Spices. Seasoning mixes without salt. Unsalted popcorn and pretzels. Fat-free sweets. What foods are not recommended? The items listed may not be a complete list. Talk with your dietitian about what dietary choices are best for you. Grains Baked goods made with fat, such as croissants, muffins, or some breads. Dry pasta or rice meal packs. Vegetables Creamed or fried vegetables. Vegetables in a cheese sauce. Regular canned vegetables (not low-sodium or reduced-sodium). Regular canned tomato sauce and paste (not low-sodium or reduced-sodium). Regular tomato and vegetable juice (not low-sodium or reduced-sodium). Rosita FirePickles. Olives. Fruits Canned fruit in a light or heavy syrup. Fried fruit. Fruit in cream or butter sauce. Meat and other protein foods Fatty cuts of meat. Ribs. Fried meat. Tomasa BlaseBacon. Sausage.  Bologna and other processed lunch meats. Salami. Fatback. Hotdogs. Bratwurst. Salted nuts and seeds. Canned beans with added salt. Canned or smoked fish. Whole eggs or egg yolks. Chicken or Mal[place]urkey with skin. Dairy Whole or 2% milk, cream, and half-and-half. Whole or full-fat cream cheese. Whole-fat or sweetened yogurt. Full-fat cheese. Nondairy creamers. Whipped toppings. Processed cheese and cheese spreads. Fats and oils Butter. Stick margarine. Lard. Shortening. Ghee. Bacon fat. Tropical oils, such as coconut, palm kernel, or palm oil. Seasoning and other foods Salted popcorn and pretzels. Onion salt, garlic salt, seasoned salt, table salt, and sea salt. Worcestershire sauce. Tartar sauce. Barbecue sauce. Teriyaki sauce. Soy sauce, including reduced-sodium. Steak sauce. Canned and packaged gravies. Fish sauce. Oyster sauce. Cocktail sauce. Horseradish that you find on the shelf. Ketchup. Mustard. Meat flavorings and tenderizers. Bouillon cubes. Hot sauce and Tabasco sauce. Premade or packaged marinades. Premade or packaged taco seasonings. Relishes. Regular salad dressings. Where to find more information:  National Heart, Lung, and Blood Institute: PopSteam.iswww.nhlbi.nih.gov  American Heart Association: www.heart.org Summary  The DASH eating plan is a healthy eating plan that has been shown to reduce high blood pressure (hypertension). It may also reduce your risk for type 2 diabetes, heart disease, and stroke.  With the DASH eating plan, you should limit salt (sodium) intake to 2,300 mg a day. If you have  hypertension, you may need to reduce your sodium intake to 1,500 mg a day.  When on the DASH eating plan, aim to eat more fresh fruits and vegetables, whole grains, lean proteins, low-fat dairy, and heart-healthy fats.  Work with your health care provider or diet and nutrition specialist (dietitian) to adjust your eating plan to your individual calorie needs. This information is not intended to  replace advice given to you by your health care provider. Make sure you discuss any questions you have with your health care provider. Document Released: 02/10/2011 Document Revised: 02/15/2016 Document Reviewed: 02/15/2016 Elsevier Interactive Patient Education  [employer].

## 2017-12-29 NOTE — Progress Notes (Signed)
Subjective:  Patient ID: Monica Williams, female    DOB: 25-Apr-1975  Age: 42 y.o. MRN: [id number]  CC: Follow-up (Discuss medications, rash on face for six months and suggesions on ways to decrease sodium)  Rash  This is a recurrent problem. The current episode started more than 1 month ago. The problem is unchanged. The affected locations include the face. The rash is characterized by redness. She was exposed to nothing. Pertinent negatives include no facial edema, fatigue, fever or joint pain. Past treatments include nothing.  FHx of RA (mother)  Reports consuming high amounts of salt for most of her life, but had to make chnages to diet recently due to evaluation by urology. she was told renal stones are due to high salt intake. Denies increase thirst or urination or polyuria or polyphagia.  Anxiety and Depression: difficulty with focusing on task at work, onset 2months. Increase job responsibilities in last 2months. Declined ref to psychologist due to long working hours. Stable mood.  Reviewed past Medical, Social and Family history today.  Outpatient Medications Prior to Visit  Medication Sig Dispense Refill  . albuterol (PROAIR HFA) 108 (90 Base) MCG/ACT inhaler Inhale two puffs every four to six hours as needed for cough or wheeze. 1 Inhaler 1  . Aspirin-Acetaminophen-Caffeine (EXCEDRIN MIGRAINE PO) Take 1 tablet by mouth as needed (migraine).     . Ibuprofen (ADVIL PO) Take 200 mg by mouth as needed (pain).     Marland Kitchen levocetirizine (XYZAL) 5 MG tablet Take 5 mg by mouth every evening.    . montelukast (SINGULAIR) 10 MG tablet Take 1 tablet (10 mg total) by mouth at bedtime. 90 tablet 1  . Olopatadine HCl 0.2 % SOLN Can use one drop in each eye once daily as needed for itchy eyes. 3 Bottle 1  . ranitidine (ZANTAC) 150 MG tablet Take 1 tablet (150 mg total) by mouth 2 (two) times daily. 60 tablet 5  . DULoxetine (CYMBALTA) 30 MG capsule Take 1 capsule (30 mg total) by mouth daily. 90  capsule 1  . ciprofloxacin (CIPRO) 250 MG tablet Take 1 tablet (250 mg total) by mouth 2 (two) times daily. 10 tablet 0  . HYDROcodone-acetaminophen (NORCO) 5-325 MG tablet Take 1 tablet by mouth every 4 (four) hours as needed for moderate pain. 6 tablet 0  . HYDROcodone-acetaminophen (NORCO/VICODIN) 5-325 MG tablet Take 1-2 tablets by mouth every 6 (six) hours as needed. 10 tablet 0  . phenazopyridine (PYRIDIUM) 95 MG tablet Take 95 mg by mouth 3 (three) times daily as needed for pain.     No facility-administered medications prior to visit.     ROS See HPI  Objective:  BP 122/76   Pulse 86   Temp 98.5 F (36.9 C)   Ht 5\' 7"  (1.702 m)   Wt 230 lb (104.3 kg)   SpO2 98%   BMI 36.02 kg/m   BP Readings from Last 3 Encounters:  12/29/17 122/76  09/02/17 (!) 151/95  08/30/17 (!) 131/99    Wt Readings from Last 3 Encounters:  12/29/17 230 lb (104.3 kg)  09/02/17 220 lb (99.8 kg)  08/29/17 230 lb (104.3 kg)   Physical Exam  Constitutional: She is oriented to person, place, and time. She appears well-developed and well-nourished.  HENT:  Head:    Cardiovascular: Normal rate.  Pulmonary/Chest: Effort normal.  Musculoskeletal: She exhibits no edema.  Neurological: She is alert and oriented to person, place, and time.  Skin: Rash noted.  Vitals reviewed.  Lab Results  Component Value Date   WBC 8.7 09/02/2017   HGB 11.3 (L) 09/02/2017   HCT 32.9 (L) 09/02/2017   PLT 227 09/02/2017   GLUCOSE 95 12/29/2017   CHOL 182 05/05/2017   TRIG 88.0 05/05/2017   HDL 35.30 (L) 05/05/2017   LDLCALC 129 (H) 05/05/2017   ALT 16 05/05/2017   AST 13 05/05/2017   NA 139 12/29/2017   K 3.8 12/29/2017   CL 106 12/29/2017   CREATININE 0.81 12/29/2017   BUN 19 12/29/2017   CO2 25 12/29/2017   TSH 1.41 05/05/2017   HGBA1C 4.9 05/05/2017    Dg Abdomen 1 View  Result Date: 09/02/2017 CLINICAL DATA:  History of kidney stones.  Right flank pain. EXAM: ABDOMEN - 1 VIEW COMPARISON:   None. FINDINGS: The bowel gas pattern is normal.  Bilateral nephrolithiasis. Calcific density in the right pelvis likely corresponds to the distal right ureteral stone demonstrated better by CT dated 08/30/2017. IMPRESSION: Bilateral nephrolithiasis. Right distal ureteral calculus. Electronically Signed   By: Ted Mcalpine M.D.   On: 09/02/2017 18:52   US Renal  Result Date: 09/02/2017 CLINICAL DATA:  Right flank pain. EXAM: RENAL / URINARY TRACT ULTRASOUND COMPLETE COMPARISON:  Body CT 08/30/2017 FINDINGS: Right Kidney: Length: 13.4 cm. Marked right hydronephrosis and hydroureter. The proximal ureter at the UPJ measures 3 cm in cross-section. Nonobstructive calculi in the lower pole of the right kidney measuring 8 mm. Left Kidney: Length: 11.5 cm. Echogenicity within normal limits. No mass or hydronephrosis visualized. Multiple nonobstructive calculi throughout the left kidney, the largest measuring 8 mm. Bladder: Appears normal for degree of bladder distention. IMPRESSION: Marked right hydroureter and hydronephrosis. Bilateral nephrolithiasis. Electronically Signed   By: Ted Mcalpine M.D.   On: 09/02/2017 16:09   Dg C-arm 1-60 Min-no Report  Result Date: 09/02/2017 Fluoroscopy was utilized by the requesting physician.  No radiographic interpretation.    Assessment & Plan:   Monica Williams was seen today for follow-up.  Diagnoses and all orders for this visit:  Hypernatremia -     Basic metabolic panel -     Osmolality, urine -     Cancel: Arginine vasopressin hormone -     Arginine vasopressin hormone  Butterfly rash -     Antinuclear Antib (ANA) -     Ambulatory referral to Dermatology  Anemia, unspecified type -     IBC panel  Depression, recurrent (HCC) -     DULoxetine (CYMBALTA) 30 MG capsule; Take 1 capsule (30 mg total) by mouth daily.   I have discontinued Trilby A. Goodroe's phenazopyridine, HYDROcodone-acetaminophen, HYDROcodone-acetaminophen, and ciprofloxacin. I am  also having her maintain her ranitidine, Ibuprofen (ADVIL PO), Aspirin-Acetaminophen-Caffeine (EXCEDRIN MIGRAINE PO), montelukast, albuterol, Olopatadine HCl, levocetirizine, and DULoxetine.  Meds ordered this encounter  Medications  . DULoxetine (CYMBALTA) 30 MG capsule    Sig: Take 1 capsule (30 mg total) by mouth daily.    Dispense:  90 capsule    Refill:  1    Order Specific Question:   Supervising Provider    Answer:   Dianne Dun [[id number]]    Follow-up: Return in about 4 weeks (around 01/26/2018) for depression and attention disturbance.(repeat PHQ 9 form).  Alysia Penna, NP

## 2018-01-01 ENCOUNTER — Encounter: Payer: Self-pay | Admitting: Nurse Practitioner

## 2018-01-01 LAB — OSMOLALITY, URINE: Osmolality, Ur: 1052 mOsm/kg (ref 50–1200)

## 2018-01-01 LAB — ANA: Anti Nuclear Antibody(ANA): NEGATIVE

## 2018-01-01 LAB — IRON, TOTAL/TOTAL IRON BINDING CAP
%SAT: 32 % (ref 16–45)
Iron: 117 ug/dL (ref 40–190)
TIBC: 365 mcg/dL (calc) (ref 250–450)

## 2018-01-01 LAB — TEST AUTHORIZATION 2

## 2018-01-01 MED ORDER — DULOXETINE HCL 30 MG PO CPEP: 30.0000 mg | ORAL_CAPSULE | Freq: Every day | ORAL | 1 refills | Status: DC

## 2018-01-01 MED FILL — DULoxetine HCL 30 MG CPEP: 30 | 90 days supply | Qty: 90 | Fill #0

## 2018-01-02 MED FILL — raNITIdine HCL 150 MG TABS: 150 | 90 days supply | Qty: 180 | Fill #2

## 2018-01-04 ENCOUNTER — Encounter: Payer: Self-pay | Admitting: Nurse Practitioner

## 2018-01-04 DIAGNOSIS — R82994 Hypercalciuria: Secondary | ICD-10-CM | POA: Insufficient documentation

## 2018-01-04 LAB — ARGININE VASOPRESSIN HORMONE
ADH: <0.8 pg/mL (ref 0.0–4.7)
Osmolality Meas: 290 mOsmol/kg (ref 275–295)

## 2018-02-08 ENCOUNTER — Other Ambulatory Visit: Payer: Self-pay | Admitting: Sports Medicine

## 2018-02-08 MED ORDER — ALUMINUM CHLORIDE 20 % EX SOLN: Freq: Every day | CUTANEOUS | 5 refills | Status: AC

## 2018-02-08 MED ORDER — CLOTRIMAZOLE 1 % EX SOLN: 1.0000 "application " | Freq: Two times a day (BID) | CUTANEOUS | 5 refills | Status: DC

## 2018-02-08 NOTE — Progress Notes (Signed)
Sent medication for tinea to patient pharmacy

## 2018-02-09 MED FILL — CLOTRIMAZOLE 1% SOLUTION: 1 | 30 days supply | Qty: 60 | Fill #0

## 2018-05-24 MED FILL — MONTELUKAST SOD 10 MG TAB: 10 | 90 days supply | Qty: 90 | Fill #1

## 2018-05-24 MED FILL — OLOPATADINE HCL 0.2% EYE DR: 0.2 | 30 days supply | Qty: 3 | Fill #1

## 2018-05-24 MED FILL — DULoxetine HCL 30 MG CPEP: 30 | 90 days supply | Qty: 90 | Fill #1

## 2018-05-26 IMAGING — CT CT RENAL STONE PROTOCOL
2 of 3 series · 16 of 46 positions shown, 18 images · non-contrast
Comparison: CT abdomen dated 04/03/2012.

CLINICAL DATA: Bilateral flank pain, worsening since [REDACTED], worse
on right. Severe nausea. History of kidney stones.

EXAM:
CT ABDOMEN AND PELVIS WITHOUT CONTRAST
TECHNIQUE: Multidetector CT imaging of the abdomen and pelvis was performed
following the standard protocol without IV contrast.

[Series 3: coronal · coronal · 0.92mm/px · 3 of 147 slices shown]
[im 49/147  soft-tissue]
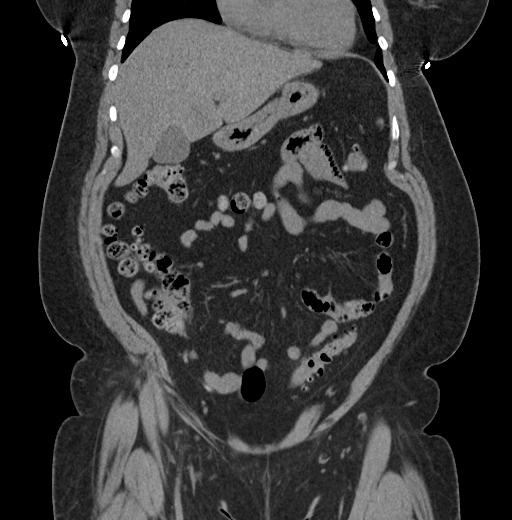
[im 65/147  soft-tissue]
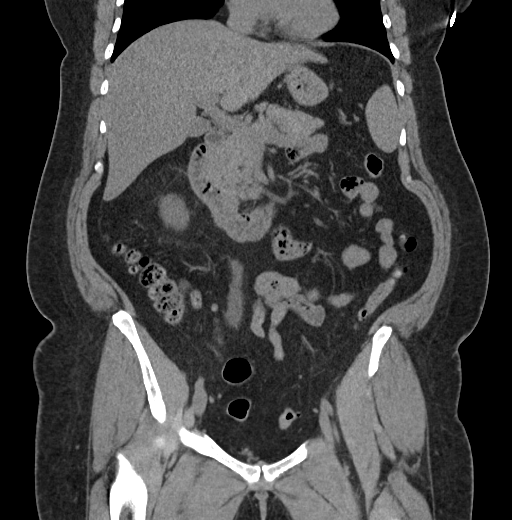
[im 82/147  soft-tissue]
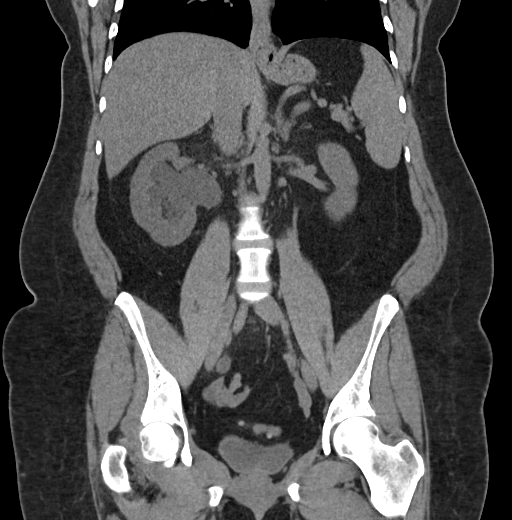

[Series 6: lung · axial · 0.87mm/px · z∈[+1641,+1743]mm · 13 of 59 slices shown, 15 images]
[im 4/59  soft-tissue]
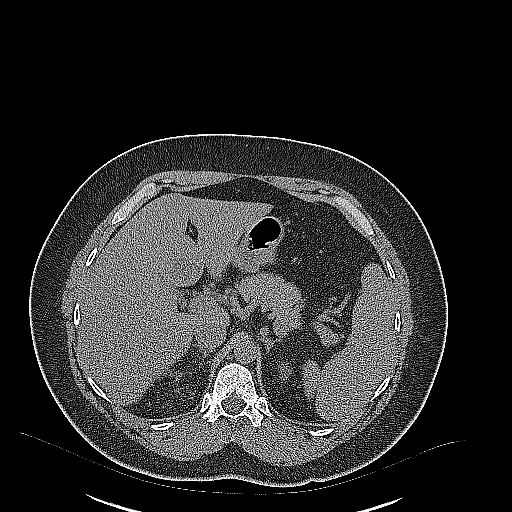
[im 4/59  bone]
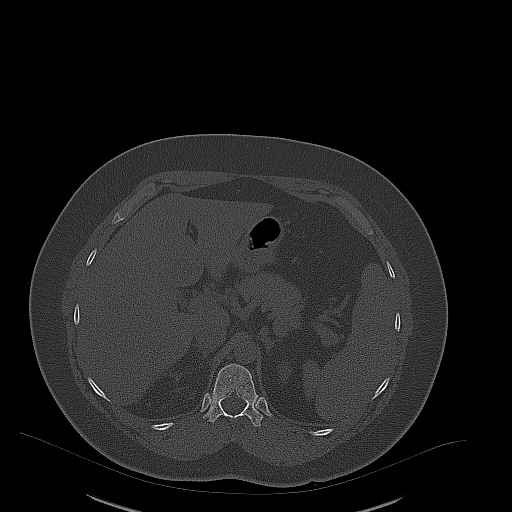
[im 8/59  soft-tissue]
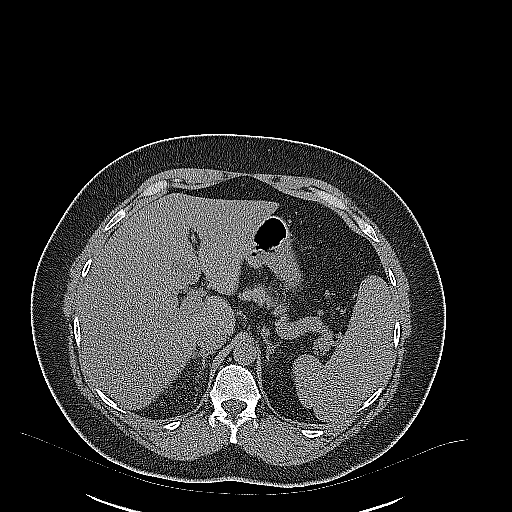
[im 12/59  soft-tissue]
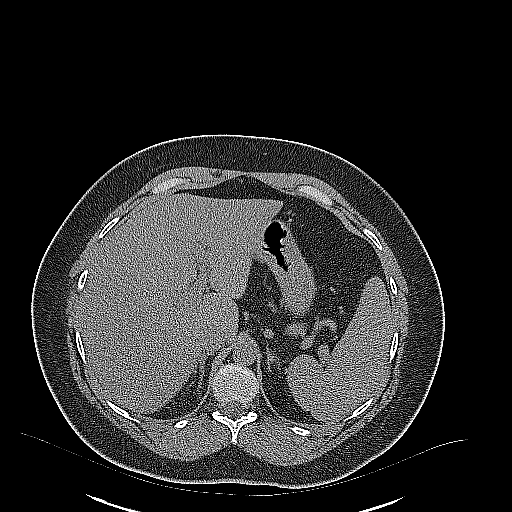
[im 17/59  soft-tissue]
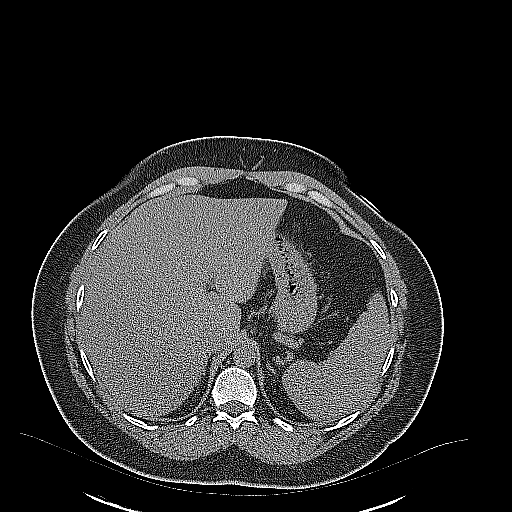
[im 21/59  soft-tissue]
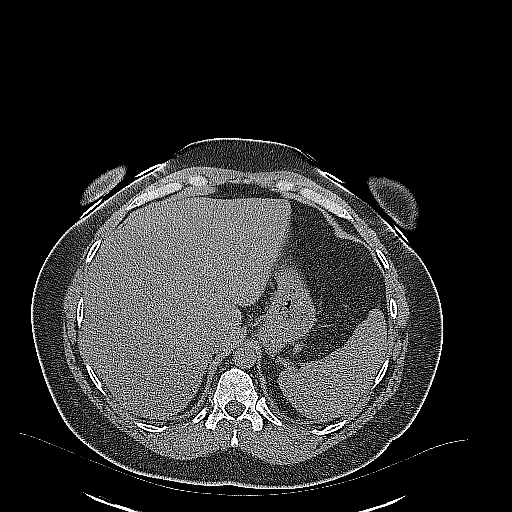
[im 25/59  soft-tissue]
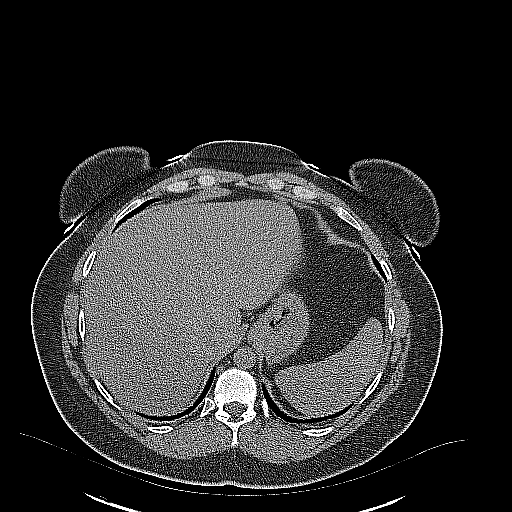
[im 30/59  soft-tissue]
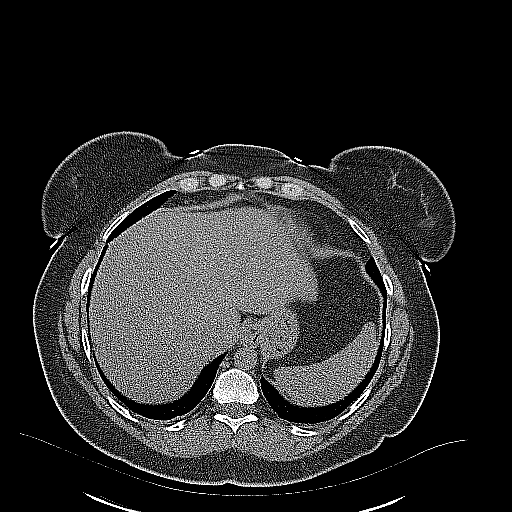
[im 34/59  soft-tissue]
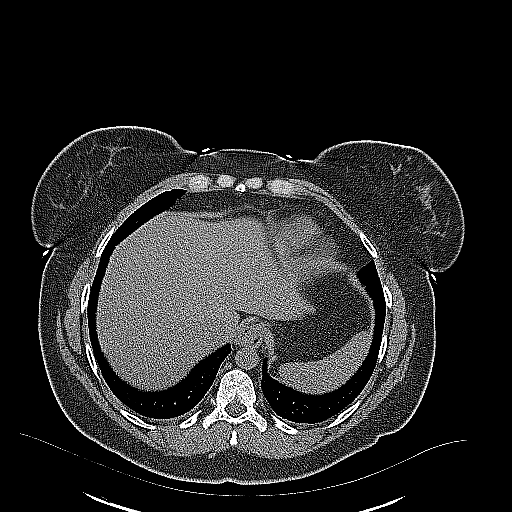
[im 38/59  soft-tissue]
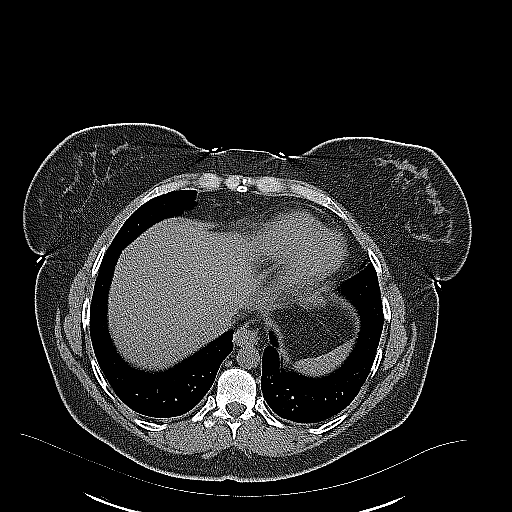
[im 38/59  bone]
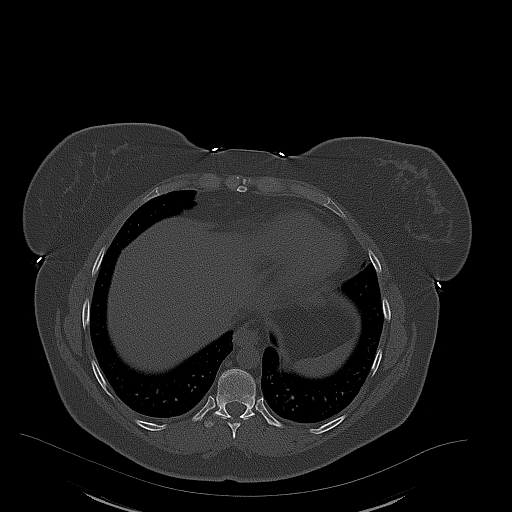
[im 42/59  soft-tissue]
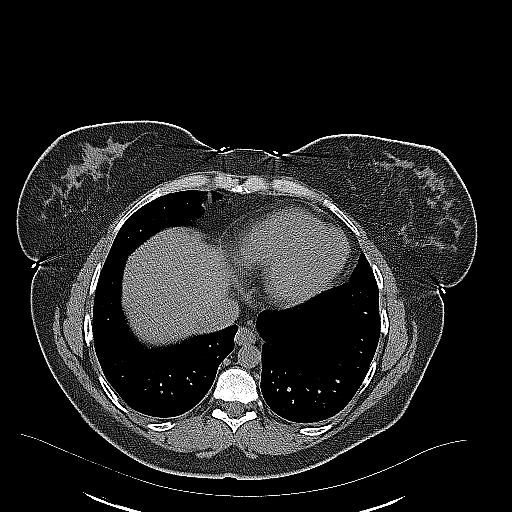
[im 47/59  soft-tissue]
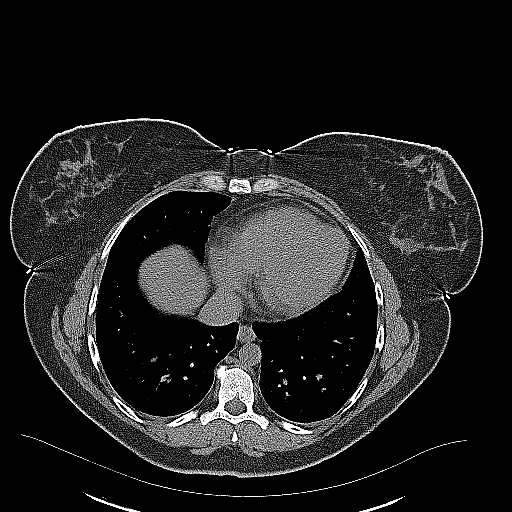
[im 51/59  soft-tissue]
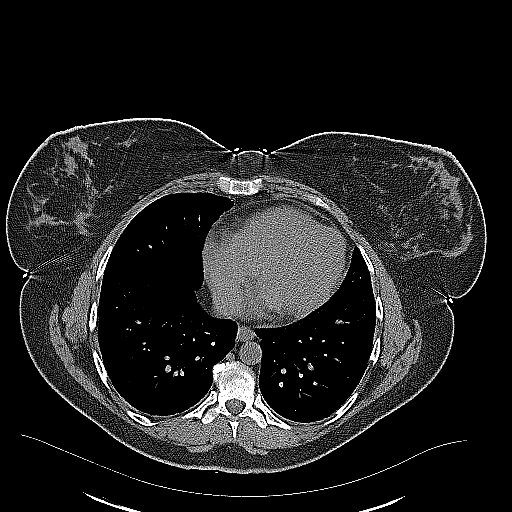
[im 55/59  soft-tissue]
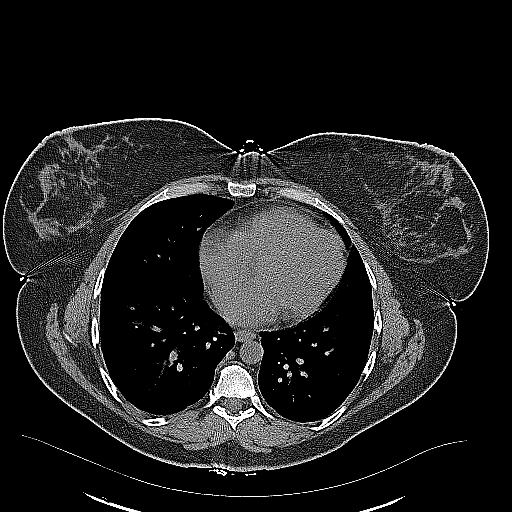

[16 of 46 positions shown; findings below may reference images not displayed]

FINDINGS: Lower chest: No acute abnormality.

Hepatobiliary: The subtle hypodense focus within segment 4 of the
left liver lobe is not significantly changed compared to previous
exams and is again most suggestive of focal fatty infiltration.
Liver otherwise unremarkable. No gallstone seen and no evidence of
cholecystitis. No bile duct dilatation.

Pancreas: Unremarkable. No pancreatic ductal dilatation or
surrounding inflammatory changes.

Spleen: Normal in size without focal abnormality.

Adrenals/Urinary Tract: Persistent right-sided hydronephrosis and
hydroureter, moderate to severe in degree, is not significantly
changed compared to the previous study of 09/05/2015 or CT abdomen
of 04/03/2012. Again noted is a 5 x 4 mm stone within the distal
right ureter.

Multiple renal stones again noted bilaterally, without change in the
short-term interval. No left-sided hydronephrosis.

Stomach/Bowel: Bowel is normal in caliber. Scattered diverticulosis
noted throughout the colon, most prominent within the descending and
sigmoid colon, without evidence of acute diverticulitis. Appendix is
normal. Stomach appears normal.

Vascular/Lymphatic: Abdominal aorta is normal in caliber. No
enlarged lymph nodes seen.

Reproductive: Unremarkable.

Other: No free fluid or abscess collection. No free intraperitoneal
air.

Musculoskeletal: Mild degenerative change in the lumbar spine. No
acute or suspicious osseous finding. Small periumbilical abdominal
wall hernia which contains fat only. Superficial soft tissues are
otherwise unremarkable.
IMPRESSION: 1. Persistent right-sided hydronephrosis and hydroureter, moderate
to severe in degree, not significantly changed compared to previous
studies of 09/05/2015 and 04/03/2012. Associated 5 mm stone within
the distal right ureter, unchanged in position.
2. Bilateral nephrolithiasis, stable.
3. Probable focal fatty infiltration within the left hepatic lobe,
grossly stable, difficulty to definitively characterize without IV
contrast.
4. Colonic diverticulosis without evidence of acute diverticulitis.
5. No new findings.

## 2018-08-31 ENCOUNTER — Telehealth (INDEPENDENT_AMBULATORY_CARE_PROVIDER_SITE_OTHER): Payer: No Typology Code available for payment source | Admitting: Nurse Practitioner

## 2018-08-31 ENCOUNTER — Other Ambulatory Visit: Payer: Self-pay

## 2018-08-31 ENCOUNTER — Encounter: Payer: Self-pay | Admitting: Nurse Practitioner

## 2018-08-31 VITALS — BP 108/69 | HR 104 | Temp 98.9°F | Ht 67.0 in

## 2018-08-31 DIAGNOSIS — R300 Dysuria: Secondary | ICD-10-CM | POA: Diagnosis not present

## 2018-08-31 DIAGNOSIS — R21 Rash and other nonspecific skin eruption: Secondary | ICD-10-CM | POA: Diagnosis not present

## 2018-08-31 DIAGNOSIS — F339 Major depressive disorder, recurrent, unspecified: Secondary | ICD-10-CM

## 2018-08-31 MED ORDER — NITROFURANTOIN MONOHYD MACRO 100 MG PO CAPS: 100.0000 mg | ORAL_CAPSULE | Freq: Two times a day (BID) | ORAL | 0 refills | Status: AC

## 2018-08-31 MED ORDER — CLOTRIMAZOLE-BETAMETHASONE 1-0.05 % EX CREA: 1.0000 "application " | TOPICAL_CREAM | Freq: Two times a day (BID) | CUTANEOUS | 0 refills | Status: AC

## 2018-08-31 MED ORDER — DULOXETINE HCL 30 MG PO CPEP: 30.0000 mg | ORAL_CAPSULE | Freq: Every day | ORAL | 3 refills | Status: DC

## 2018-08-31 MED FILL — DULoxetine HCL 30 MG CPEP: 30 | 90 days supply | Qty: 90 | Fill #0

## 2018-08-31 MED FILL — NITROFURANTOIN MONO-MCR 100: 100 | 5 days supply | Qty: 10 | Fill #0

## 2018-08-31 MED FILL — CLOTRIMAZOLE-BETAMETHASONE: 1-0.05 | 30 days supply | Qty: 45 | Fill #0

## 2018-08-31 NOTE — Patient Instructions (Signed)
Call office if no improvement. Will need referral to dermatology if no improvement in rash. Will need UA completed if no improvement in urinary symptoms.

## 2018-08-31 NOTE — Progress Notes (Signed)
Virtual Visit via Video Note  I connected with Monica Williams on 08/31/18 at  3:30 PM EDT by a video enabled telemedicine application and verified that I am speaking with the correct person using two identifiers.  Location: Patient: Home  Provider: Office   I discussed the limitations of evaluation and management by telemedicine and the availability of in person appointments. The patient expressed understanding and agreed to proceed.  CC: Abd pain and dysuria, cymbalta refill, and rash on right leg x 6months  History of Present Illness: Anxiety and Depression: Stable with cymbalta, needs medication refill. Reports increased anxiety due to work load and current Pandemic. Plans to schedule appt with E[facility]  Abdominal Pain This is a new problem. The current episode started in the past 7 days. The problem has been gradually worsening. The pain is located in the suprapubic region. The quality of the pain is colicky, a sensation of fullness and cramping. The abdominal pain does not radiate. Associated symptoms include dysuria and frequency. Pertinent negatives include no constipation, diarrhea, flatus, hematuria or melena. Nothing aggravates the pain. The pain is relieved by nothing. She has tried nothing for the symptoms. Prior diagnostic workup includes ultrasound and CT scan. There is no history of GERD or irritable bowel syndrome.  Rash This is a new problem. The current episode started more than 1 month ago. The problem has been gradually worsening since onset. The affected locations include the right lower leg. The rash is characterized by itchiness, scaling and dryness. It is unknown if there was an exposure to a precipitant. Pertinent negatives include no diarrhea or joint pain. Past treatments include anti-itch cream, antihistamine, moisturizer and topical steroids. The treatment provided no relief.   Observations/Objective: Physical Exam  Constitutional: She is oriented to person, place,  and time. No distress.  Cardiovascular: Normal rate.  Pulmonary/Chest: Effort normal.  Musculoskeletal: Normal range of motion.  Neurological: She is alert and oriented to person, place, and time.  Skin: Skin is intact. Rash noted. Rash is maculopapular. There is erythema.     Psychiatric: She has a normal mood and affect. Her behavior is normal. Thought content normal.   Assessment and Plan: [name] was seen today for abdominal pain, rash and medication refill.  Diagnoses and all orders for this visit:  Depression, recurrent ([place]) -     DULoxetine (CYMBALTA) 30 MG capsule; Take 1 capsule (30 mg total) by mouth daily.  Dysuria -     nitrofurantoin, macrocrystal-monohydrate, (MACROBID) 100 MG capsule; Take 1 capsule (100 mg total) by mouth 2 (two) times daily for 5 days.  Rash, skin -     clotrimazole-betamethasone (LOTRISONE) cream; Apply 1 application topically 2 (two) times daily.   Follow Up Instructions: Call office if no improvement. Will need referral to dermatology if no improvement in rash. Will need UA completed if no improvement in urinary symptoms.   I discussed the assessment and treatment plan with the patient. The patient was provided an opportunity to ask questions and all were answered. The patient agreed with the plan and demonstrated an understanding of the instructions.   The patient was advised to call back or seek an in-person evaluation if the symptoms worsen or if the condition fails to improve as anticipated.   Wilfred Lacy, NP

## 2018-10-15 ENCOUNTER — Encounter: Payer: Self-pay | Admitting: Physician Assistant

## 2018-10-15 ENCOUNTER — Telehealth: Payer: No Typology Code available for payment source | Admitting: Physician Assistant

## 2018-10-15 DIAGNOSIS — M545 Low back pain, unspecified: Secondary | ICD-10-CM

## 2018-10-15 MED ORDER — CYCLOBENZAPRINE HCL 10 MG PO TABS: 10.0000 mg | ORAL_TABLET | Freq: Three times a day (TID) | ORAL | 0 refills | Status: AC | PRN

## 2018-10-15 MED ORDER — NAPROXEN 500 MG PO TABS: 500.0000 mg | ORAL_TABLET | Freq: Two times a day (BID) | ORAL | 0 refills | Status: AC

## 2018-10-15 NOTE — Progress Notes (Signed)

## 2018-11-06 ENCOUNTER — Encounter (HOSPITAL_COMMUNITY): Payer: Self-pay

## 2018-11-06 ENCOUNTER — Emergency Department (HOSPITAL_COMMUNITY): Payer: No Typology Code available for payment source

## 2018-11-06 ENCOUNTER — Other Ambulatory Visit: Payer: Self-pay

## 2018-11-06 ENCOUNTER — Emergency Department (HOSPITAL_COMMUNITY)
Admission: EM | Admit: 2018-11-06 | Discharge: 2018-11-06 | Disposition: A | Discharge status: Home / Self Care | Payer: No Typology Code available for payment source | Attending: Emergency Medicine | Admitting: Emergency Medicine

## 2018-11-06 DIAGNOSIS — J45909 Unspecified asthma, uncomplicated: Secondary | ICD-10-CM | POA: Diagnosis not present

## 2018-11-06 DIAGNOSIS — R109 Unspecified abdominal pain: Secondary | ICD-10-CM | POA: Diagnosis present

## 2018-11-06 DIAGNOSIS — Z79899 Other long term (current) drug therapy: Secondary | ICD-10-CM | POA: Diagnosis not present

## 2018-11-06 DIAGNOSIS — N200 Calculus of kidney: Secondary | ICD-10-CM | POA: Diagnosis not present

## 2018-11-06 LAB — BASIC METABOLIC PANEL
Anion gap: 11 (ref 5–15)
BUN: 20 mg/dL (ref 6–20)
CO2: 21 mmol/L — ABNORMAL LOW (ref 22–32)
Calcium: 9.5 mg/dL (ref 8.9–10.3)
Chloride: 105 mmol/L (ref 98–111)
Creatinine, Ser: 1.09 mg/dL — ABNORMAL HIGH (ref 0.44–1.00)
GFR calc Af Amer: >60 mL/min (ref >60)
GFR calc non Af Amer: >60 mL/min (ref >60)
Glucose, Bld: 118 mg/dL — ABNORMAL HIGH (ref 70–99)
Potassium: 4 mmol/L (ref 3.5–5.1)
Sodium: 137 mmol/L (ref 135–145)

## 2018-11-06 LAB — CBC WITH DIFFERENTIAL/PLATELET
Abs Immature Granulocytes: 0.04 10*3/uL (ref 0.00–0.07)
Basophils Absolute: 0 10*3/uL (ref 0.0–0.1)
Basophils Relative: 0 %
Eosinophils Absolute: 0 10*3/uL (ref 0.0–0.5)
Eosinophils Relative: 0 %
HCT: 39.3 % (ref 36.0–46.0)
Hemoglobin: 13.2 g/dL (ref 12.0–15.0)
Immature Granulocytes: 0 %
Lymphocytes Relative: 6 %
Lymphs Abs: 0.9 10*3/uL (ref 0.7–4.0)
MCH: 29.8 pg (ref 26.0–34.0)
MCHC: 33.6 g/dL (ref 30.0–36.0)
MCV: 88.7 fL (ref 80.0–100.0)
Monocytes Absolute: 0.5 10*3/uL (ref 0.1–1.0)
Monocytes Relative: 4 %
Neutro Abs: 12.6 10*3/uL — ABNORMAL HIGH (ref 1.7–7.7)
Neutrophils Relative %: 90 %
Platelets: 309 10*3/uL (ref 150–400)
RBC: 4.43 MIL/uL (ref 3.87–5.11)
RDW: 13.9 % (ref 11.5–15.5)
WBC: 14 10*3/uL — ABNORMAL HIGH (ref 4.0–10.5)
nRBC: 0 % (ref 0.0–0.2)

## 2018-11-06 LAB — URINALYSIS, ROUTINE W REFLEX MICROSCOPIC
Bilirubin Urine: NEGATIVE
Glucose, UA: NEGATIVE mg/dL
Ketones, ur: 5 mg/dL — AB
Leukocytes,Ua: NEGATIVE
Nitrite: NEGATIVE
Protein, ur: NEGATIVE mg/dL
RBC / HPF: >50 RBC/hpf — ABNORMAL HIGH (ref 0–5)
Specific Gravity, Urine: 1.015 (ref 1.005–1.030)
pH: 5 (ref 5.0–8.0)

## 2018-11-06 MED ORDER — KETOROLAC TROMETHAMINE 15 MG/ML IJ SOLN
15.0000 mg | Freq: Once | INTRAMUSCULAR | Status: AC
  Administered 2018-11-06: 09:00:00 15 mg via INTRAVENOUS
  Filled 2018-11-06: qty 1

## 2018-11-06 MED ORDER — SODIUM CHLORIDE 0.9 % IV BOLUS (SEPSIS)
1000.0000 mL | Freq: Once | INTRAVENOUS | Status: AC
  Administered 2018-11-06: 09:00:00 1000 mL via INTRAVENOUS

## 2018-11-06 MED ORDER — PROMETHAZINE HCL 25 MG/ML IJ SOLN
12.5000 mg | Freq: Once | INTRAMUSCULAR | Status: AC
  Administered 2018-11-06: 12.5 mg via INTRAVENOUS
  Filled 2018-11-06: qty 1

## 2018-11-06 NOTE — Discharge Instructions (Addendum)
Thank you for allowing me to care for you today. Please return to the emergency department if you have new or worsening symptoms. Take your medications as instructed.  ° °

## 2018-11-06 NOTE — ED Triage Notes (Signed)
Pt complains of flank pain due to kidney stone pain, this is her 5th one and the pain is the same Pt states that she can't urinate at all now

## 2018-11-06 NOTE — ED Provider Notes (Signed)
[facility]-EMERGENCY DEPT Provider Note   CSN: [id number] Arrival date & time: 11/06/18  0438     History   Chief Complaint Chief Complaint  Patient presents with   Flank Pain    HPI Monica Williams is a 43 y.o. female.     Monica Williams is a 43 y.o. year old female with a prior history of urolithiasis, status post multiple ureteroscopic and shockwave lithotripsy treatments presenting to the emergency department for acute right-sided flank pain which started around 7 or 8 PM last night.  Patient reports that this is consistent with her prior history of kidney stones.  Reports that the pain migrated from her right flank into the right lower abdominal region.  Reports that she has had some nausea and vomiting.  Reports that pain is improved with hydrocodone that she took at home.  Reports that she has not been able to urinate normally since sometime yesterday.  Denies any fever, chills, vaginal complaints.     Past Medical History:  Diagnosis Date   Asthma    last attack 05/2015   Depression    Family history of adverse reaction to anesthesia    mother has nausea and difficulty waking up   Frequent headaches    Frequent UTI    Headache    migraines   History of chickenpox    History of colon polyps    History of kidney stones    Kidney calculus    multiple stone HX   Renal disorder     Patient Active Problem List   Diagnosis Date Noted   Hypercalciuria 01/04/2018   Vaginal bleeding, abnormal 05/08/2017   Hyperglycemia 05/08/2017   Depression, recurrent (HCC) 05/08/2017   GERD (gastroesophageal reflux disease) 04/07/2017   Urolithiasis 09/05/2015   Renal colic on right side 09/05/2015    Past Surgical History:  Procedure Laterality Date   ABLATION     CYSTOSCOPY W/ URETEROSCOPY     CYSTOSCOPY WITH RETROGRADE PYELOGRAM, URETEROSCOPY AND STENT PLACEMENT  04/09/2012   Procedure: CYSTOSCOPY WITH RETROGRADE PYELOGRAM, URETEROSCOPY AND  STENT PLACEMENT;  Surgeon: Marcine Matar, MD;  Location: [facility];  Service: Urology;  Laterality: Right;   CYSTOSCOPY/URETEROSCOPY/HOLMIUM LASER/STENT PLACEMENT Right 02/01/2016   Procedure: CYSTOSCOPY/RIGHT URETEROSCOPY/ HOLMIUM LASER/ RIGHT STENT PLACEMENT;  Surgeon: Bjorn Pippin, MD;  Location: [facility];  Service: Urology;  Laterality: Right;   CYSTOSCOPY/URETEROSCOPY/HOLMIUM LASER/STENT PLACEMENT Right 09/02/2017   Procedure: CYSTOSCOPY/URETEROSCOPY/RETROGRADE /STENT PLACEMENT;  Surgeon: Marcine Matar, MD;  Location: [facility];  Service: Urology;  Laterality: Right;   HOLMIUM LASER APPLICATION  04/09/2012   Procedure: HOLMIUM LASER APPLICATION;  Surgeon: Marcine Matar, MD;  Location: [facility];  Service: Urology;  Laterality: Right;   LITHOTRIPSY  2004   TUBAL LIGATION  2005     OB History   No obstetric history on file.      Home Medications    Prior to Admission medications   Medication Sig Start Date End Date Taking? Authorizing Provider  albuterol (PROAIR HFA) 108 (90 Base) MCG/ACT inhaler Inhale two puffs every four to six hours as needed for cough or wheeze. 06/15/17  Yes Kozlow, Alvira Philips, MD  Aspirin-Acetaminophen-Caffeine (EXCEDRIN MIGRAINE PO) Take 1 tablet by mouth as needed (migraine).    Yes [provider]  DULoxetine (CYMBALTA) 30 MG capsule Take 1 capsule (30 mg total) by mouth daily. 08/31/18  Yes Nche, Bonna Gains, NP  Ibuprofen (ADVIL PO) Take 200 mg by mouth as needed (pain).  Yes [provider]  levocetirizine (XYZAL) 5 MG tablet Take 5 mg by mouth daily as needed for allergies.    Yes [provider]  montelukast (SINGULAIR) 10 MG tablet Take 1 tablet (10 mg total) by mouth at bedtime. Patient taking differently: Take 10 mg by mouth daily as needed (allergies).  06/15/17  Yes Kozlow, Donnamarie Poag, MD  aluminum chloride (DRYSOL) 20 % external solution Apply topically at bedtime. Patient not taking:  Reported on 08/31/2018 02/08/18   Landis Martins, DPM  clotrimazole-betamethasone (LOTRISONE) cream Apply 1 application topically 2 (two) times daily. Patient not taking: Reported on 11/06/2018 08/31/18   Nche, Charlene Brooke, NP  cyclobenzaprine (FLEXERIL) 10 MG tablet Take 1 tablet (10 mg total) by mouth 3 (three) times daily as needed for muscle spasms. 10/15/18   Waldon Merl, PA-C  naproxen (NAPROSYN) 500 MG tablet Take 1 tablet (500 mg total) by mouth 2 (two) times daily with a meal. Patient not taking: Reported on 11/06/2018 10/15/18   Waldon Merl, PA-C  Olopatadine HCl 0.2 % SOLN Can use one drop in each eye once daily as needed for itchy eyes. Patient not taking: Reported on 11/06/2018 06/15/17   Kozlow, Donnamarie Poag, MD    Family History Family History  Problem Relation Age of Onset   Alcohol abuse Mother    Arthritis Mother    Asthma Mother    Depression Mother    Hypertension Mother    Alcohol abuse Father    Asthma Father    Hypertension Father    Learning disabilities Father    Miscarriages / [place] Sister    Depression Brother    Early death Brother    Learning disabilities Brother    Mental illness Brother    Bipolar disorder Brother    Suicidality Brother    Asthma Daughter    Depression Son    Learning disabilities Son    Mental illness Son    Arthritis Maternal Grandmother    Heart disease Maternal Grandmother    Hypertension Maternal Grandmother    Mental illness Maternal Grandfather    Cancer Paternal Grandmother        breast cancer   Hypertension Paternal Grandmother    Hypertension Paternal Grandfather    Asthma Brother     Social History Social History   Tobacco Use   Smoking status: Never Smoker   Smokeless tobacco: Never Used  Substance Use Topics   Alcohol use: Yes    Comment: social   Drug use: No     Allergies   Penicillins and Sulfa antibiotics   Review of Systems Review of Systems  Constitutional:  Positive for appetite change. Negative for fever.  Respiratory: Negative for cough and shortness of breath.   Gastrointestinal: Positive for abdominal pain, nausea and vomiting. Negative for constipation and diarrhea.  Genitourinary: Positive for flank pain. Negative for pelvic pain, vaginal bleeding, vaginal discharge and vaginal pain.  Skin: Negative for rash.  Neurological: Negative for dizziness.  All other systems reviewed and are negative.    Physical Exam Updated Vital Signs BP (!) 123/91    Pulse 70    Temp 98 F (36.7 C) (Oral)    Resp 15    Ht 5\' 7"  (1.702 m)    Wt 103 kg    SpO2 100%    BMI 35.55 kg/m   Physical Exam Vitals signs and nursing note reviewed.  Constitutional:      Appearance: Normal appearance.  HENT:  Head: Normocephalic.     Mouth/Throat:     Mouth: Mucous membranes are moist.  Eyes:     Conjunctiva/sclera: Conjunctivae normal.  Cardiovascular:     Rate and Rhythm: Normal rate and regular rhythm.  Pulmonary:     Effort: Pulmonary effort is normal.  Abdominal:     General: Abdomen is flat.     Tenderness: There is abdominal tenderness (Right lower quadrant.  Tenderness at McBurney's point). There is right CVA tenderness. There is no guarding.  Skin:    General: Skin is dry.  Neurological:     Mental Status: She is alert.  Psychiatric:        Mood and Affect: Mood normal.      ED Treatments / Results  Labs (all labs ordered are listed, but only abnormal results are displayed) Labs Reviewed  URINALYSIS, ROUTINE W REFLEX MICROSCOPIC - Abnormal; Notable for the following components:      Result Value   Hgb urine dipstick LARGE (*)    Ketones, ur 5 (*)    RBC / HPF >50 (*)    Bacteria, UA RARE (*)    All other components within normal limits  CBC WITH DIFFERENTIAL/PLATELET - Abnormal; Notable for the following components:   WBC 14.0 (*)    Neutro Abs 12.6 (*)    All other components within normal limits  BASIC METABOLIC PANEL -  Abnormal; Notable for the following components:   CO2 21 (*)    Glucose, Bld 118 (*)    Creatinine, Ser 1.09 (*)    All other components within normal limits    EKG None  Radiology Ct Renal Stone Study  Result Date: 11/06/2018 CLINICAL DATA:  Right flank pain. EXAM: CT ABDOMEN AND PELVIS WITHOUT CONTRAST TECHNIQUE: Multidetector CT imaging of the abdomen and pelvis was performed following the standard protocol without IV contrast. COMPARISON:  CT scan of September 27, 2017. FINDINGS: Lower chest: No acute abnormality. Hepatobiliary: No focal liver abnormality is seen. No gallstones, gallbladder wall thickening, or biliary dilatation. Pancreas: Unremarkable. No pancreatic ductal dilatation or surrounding inflammatory changes. Spleen: Normal in size without focal abnormality. Adrenals/Urinary Tract: Adrenal glands appear normal. Bilateral nephrolithiasis is noted. Severe right hydroureteronephrosis with perinephric stranding is noted, although obstructing calculus is no longer visualized. 7 mm calculus is noted in dependent portion of urinary bladder most consistent with recently passed stone. Stomach/Bowel: Stomach is within normal limits. Appendix appears normal. No evidence of bowel wall thickening, distention, or inflammatory changes. Sigmoid diverticulosis is noted without inflammation. Vascular/Lymphatic: No significant vascular findings are present. No enlarged abdominal or pelvic lymph nodes. Reproductive: Uterus and bilateral adnexa are unremarkable. Other: No abdominal wall hernia or abnormality. No abdominopelvic ascites. Musculoskeletal: No acute or significant osseous findings. IMPRESSION: Severe right hydroureteronephrosis is noted, most likely due to recently passed 7 mm calculus that now rests in dependent portion of urinary bladder. Bilateral nephrolithiasis is noted. Sigmoid diverticulosis without inflammation. Electronically Signed   By: Lupita RaiderJames  Green Jr M.D.   On: 11/06/2018 08:39     Procedures Procedures (including critical care time)  Medications Ordered in ED Medications  sodium chloride 0.9 % bolus 1,000 mL (1,000 mLs Intravenous New Bag/Given 11/06/18 0838)  ketorolac (TORADOL) 15 MG/ML injection 15 mg (15 mg Intravenous Given 11/06/18 0839)  promethazine (PHENERGAN) injection 12.5 mg (12.5 mg Intravenous Given 11/06/18 0839)     Initial Impression / Assessment and Plan / ED Course  I have reviewed the triage vital signs and the nursing notes.  Pertinent labs & imaging results that were available during my care of the patient were reviewed by me and considered in my medical decision making (see chart for details).  Clinical Course as of Nov 05 1028  Tue Nov 06, 2018  [id number] Patient's pain was controlled with Toradol and has not had any vomiting since Phenergan.  Labs are significant for creatinine of 1.09 white blood cell count of 14.0.  Waiting on urinalysis.  It appears she has passed a 7 mm stone on the right and it is now sitting in her bladder.  If urine appears okay patient is willing to go home and follow-up with urology.   [KM]  1028 Patient reports passing of the stone here in the ED with urination.  No signs of any infection and pain is totally controlled.  Will discharge home with routine follow-up with urology   [KM]    Clinical Course User Index [KM] Arlyn DunningMcLean, Dainelle Hun A, PA-C       Based on review of vitals, medical screening exam, lab work and/or imaging, there does not appear to be an acute, emergent etiology for the patient's symptoms. Counseled pt on good return precautions and encouraged both PCP and ED follow-up as needed.  Prior to discharge, I also discussed incidental imaging findings with patient in detail and advised appropriate, recommended follow-up in detail.  Clinical Impression: 1. Kidney stone     Disposition: Discharge  Prior to providing a prescription for a controlled substance, I independently reviewed the patient's recent  prescription history on the [place] Controlled Substance Reporting System. The patient had no recent or regular prescriptions and was deemed appropriate for a brief, less than 3 day prescription of narcotic for acute analgesia.  This note was prepared with assistance of Conservation officer, historic buildingsDragon voice recognition software. Occasional wrong-word or sound-a-like substitutions may have occurred due to the inherent limitations of voice recognition software.   Final Clinical Impressions(s) / ED Diagnoses   Final diagnoses:  Kidney stone    ED Discharge Orders    None       Jeral PinchMcLean, Chanie Soucek A, PA-C 11/06/18 1030    Little, Ambrose Finlandachel Morgan, MD 11/07/18 [phone]

## 2018-11-06 NOTE — ED Notes (Signed)
Pt stated that she tried several times to urinate.  Will try again in 30 minutes.

## 2019-01-25 ENCOUNTER — Encounter: Payer: Self-pay | Admitting: Nurse Practitioner

## 2019-01-25 DIAGNOSIS — F339 Major depressive disorder, recurrent, unspecified: Secondary | ICD-10-CM

## 2019-01-25 MED ORDER — DULOXETINE HCL 30 MG PO CPEP: 30.0000 mg | ORAL_CAPSULE | Freq: Two times a day (BID) | ORAL | 1 refills | Status: AC

## 2019-01-25 MED FILL — DULoxetine HCL 30 MG CPEP: 30 | 30 days supply | Qty: 60 | Fill #0

## 2019-02-18 MED FILL — DULoxetine HCL 30 MG CPEP: 30 | 90 days supply | Qty: 90 | Fill #1

## 2019-03-20 MED FILL — DULoxetine HCL 30 MG CPEP: 30 | 90 days supply | Qty: 90 | Fill #1

## 2019-06-21 MED FILL — DULoxetine HCL 30 MG CPEP: 30 | 90 days supply | Qty: 90 | Fill #2
